# Patient Record
Sex: Male | Born: 1955 | Race: White | Hispanic: No | Marital: Married
Health system: Midwestern US, Academic
[De-identification: ages and names within clinical notes are randomized; demographics above are authoritative.]

---

## 2016-11-26 ENCOUNTER — Ambulatory Visit: Admit: 2016-11-26 | Discharge: 2016-11-27 | Payer: BC Managed Care – PPO | Primary: Family

## 2016-11-26 ENCOUNTER — Encounter: Admit: 2016-11-26 | Discharge: 2016-11-26 | Payer: BC Managed Care – PPO | Primary: Family

## 2016-11-26 DIAGNOSIS — E119 Type 2 diabetes mellitus without complications: ICD-10-CM

## 2016-11-26 DIAGNOSIS — I2510 Atherosclerotic heart disease of native coronary artery without angina pectoris: Principal | ICD-10-CM

## 2016-11-26 DIAGNOSIS — Z720 Tobacco use: ICD-10-CM

## 2016-11-26 DIAGNOSIS — T50905A Adverse effect of unspecified drugs, medicaments and biological substances, initial encounter: ICD-10-CM

## 2016-11-26 DIAGNOSIS — E785 Hyperlipidemia, unspecified: ICD-10-CM

## 2016-11-26 DIAGNOSIS — I10 Essential (primary) hypertension: ICD-10-CM

## 2016-11-26 NOTE — Progress Notes
Date of Service: 11/26/2016    Adam Galloway is a 61 y.o. male.       HPI     It was a pleasure to see Adam Galloway my office today for his cardiovascular follow-up.  He has been doing fairly good as far as his cardiac symptoms are concerned.  He did get evaluated and admitted for a few hours in the emergency room in [place] because he was dehydrated and had electrolyte abnormalities including hyperkalemia.  He denies any chest discomfort.  No significant shortness of breath.  Unfortunately still continues to smoke.  I spent a lot of time last time talking to him about smoking cessation.  We discussed this again today.  I have counseled him on the significant risks he carries with him from continued tobacco abuse since he is already had a heart attack.  I do not believe he has any plans to quit at least at this time.  On physical examination is alert and oriented in no distress.  Vital signs are stable.  Lungs are clear.  Heart sounds are regular.  Extremity shows no edema.  Peripheral pulses appeared to be good.  I am concerned about this last admission with hyperkalemia.  He is on ACE inhibitor and spironolactone.  That combination can also cause hyperkalemia.  He says his last potassium was down to normal limits.  I would like to follow-up a BMP to make sure his potassium is good.  If his potassium continues to remain high normal recommend discontinuing the Spironolactone.  I also ordered a follow-up echo Doppler.  Last echo Doppler had shown a reduction in his left ventricular function.  Total time spent with the patient including smoking cessation counseling was approximately 25 minutes in the office.         Vitals:    11/26/16 0834 11/26/16 0842   BP: 116/80 107/72   Pulse: 81    Weight: 88.5 kg (195 lb)    Height: 1.829 m (6')      Body mass index is 26.45 kg/m???.     Past Medical History  Patient Active Problem List    Diagnosis Date Noted   ??? Asystole (HCC) 05/17/2016   ??? Essential hypertension 04/16/2015 ??? Dyslipidemia    ??? DM (diabetes mellitus), type 2 (HCC)    ??? Tobacco abuse    ??? HLD (hyperlipidemia) 01/16/2008   ??? CAD (coronary artery disease) 01/16/2008     2009-Previous anterior wall myocardial infarction with complete recovery and status post PCI of the diagonal branch.  12/16/14- [facility]- STEMI- PCI- PLAD    8/8/16[facility]-   Echocardiogram- EF 50-55%, no focal left ventricular wall motion abnormalities     ??? STEMI (ST elevation myocardial infarction) (HCC) 01/16/2008   ??? Anxiety 01/16/2008   ??? Hyperglycemia 01/16/2008         Review of Systems   HENT: Positive for congestion.    Respiratory: Positive for snoring.    Musculoskeletal: Positive for joint pain.   Neurological: Positive for excessive daytime sleepiness and sensory change.   All other systems reviewed and are negative.      Physical Exam  General Appearance: moderately over weight   Lips &???Oral Mucosa: no pallor or cyanosis   Neck Veins: neck veins are flat, neck veins are not distended   Thyroid: no nodules, masses, tenderness or enlargement   Chest Inspection: chest is normal in appearance   Respiratory Effort: breathing comfortably, no respiratory distress   Auscultation/Percussion: lungs clear  to auscultation, no rales or rhonchi, no wheezing   PMI: PMI not enlarged or displaced   Cardiac Rhythm: regular rhythm and normal rate   Cardiac Auscultation: S1, S2 normal, no rub, no gallop   Murmurs: no murmur   Carotid Arteries: normal carotid upstroke bilaterally, no bruit   Brachial Arteries: normal symmetric brachial pulses   Radial Arteries: normal symmetric radial pulses   Abdominal Aorta: no abdominal aortic bruit   Pedal Pulses: normal symmetric pedal pulses   Lower Extremity Edema: no lower extremity edema   Abdominal Exam: soft, non-tender, no masses, bowel sounds normal   Liver &???Spleen: no organomegaly   Orientation: oriented to time, place and person     Cardiovascular Studies 1. January 13, 2008: Cardiac catheterization secondary to an acute anterior wall   myocardial infarction. Reperfusion of LAD, high-grade lesion in the diagonal branch,   a 2.5 Xience stent placed there. Full report in the [facility].   2. January 16, 2008: Echo Doppler ejection fraction improved to 60 percent.   3. January 16, 2008: Renal duplex study. No evidence of any significant stenosis in   the renal arteries.   4. January 11, 2008: Exercise stress MPI. No evidence of fixed or reversible   perfusion defect, ejection fraction 51 percent.   5. October 2010, exercise stress test negative for any significant ischemia. Normal ejection fraction   6. November 04, 2012, regadenoson stress thallium showed no evidence of any significant ischemia. Ejection fraction at 50%.   7. December 17, 2014 cardiac catheterization showed previously placed stent in the LAD was patent. A proximal to mid LAD lesion of 80% had a 3 x 28 mm drug-eluting stent placed and post dilated with a 3 x 20 mm balloon. There was also a 60% lesion of the left PDA and 90% lesion of the nondominant right.   8. December 16, 2014 echo Doppler revealed a left ventricular function of 50% to 55%. No significant valvular abnormalities were noted.   9. May 17, 2016 echo Doppler.  Left ventricular cavity size is normal.  Moderately decreased systolic function with ejection fraction of 40-45%.  No significant valvular abnormalities.  Trace pericardial effusion.  10. May 17, 2016 cardiac catheterization.  Moderate coronary artery disease involving the left circumflex artery as well as the posterior left ventricular and posterior descending arterial branch of the left circumflex artery.  High-grade lesion of a small nondominant right coronary artery. Patent stents in the proximal left anterior descending artery and the proximal first diagonal vessel.  Normal left ventricular end-diastolic pressures.    Problems Addressed Today  No diagnosis found. Assessment and Plan     1.  Coronary artery disease with previous anterior wall myocardial function.  Patient had a repeat cardiac catheterization secondary to a complication after his stress test in the office.  Cardiac catheterization showed patent stents.  Patient continues to do well with no symptoms suggestive of angina.  2.  Ischemic cardiomyopathy.  Patient on optimal medications.  Would recommend a follow-up echo Doppler to see if there has been interval improvement in patient's left ventricular function  3.  Hypertension.  Blood pressures are pretty well controlled.  4.  Dyslipidemia.  Good lipid numbers.  5.  Type 2 diabetes mellitus.  Follows with his PCP.  6.  History of smoking.  Unfortunately patient continues to smoke.  He has no plans to quit at this time.         Current Medications (including today's revisions)  ???  ALPRAZolam (XANAX) 0.5 mg tablet Take 0.5 mg by mouth as Needed.   ??? aspirin EC 81 mg tablet Take 81 mg by mouth daily. Take with food.   ??? atorvastatin (LIPITOR) 20 mg tablet Take 20 mg by mouth daily.   ??? carvedilol (COREG) 12.5 mg tablet Take 1 tablet by mouth twice daily. Take with food.   ??? citalopram (CELEXA) 20 mg PO tablet Take  by mouth Daily.   ??? empagliflozin (JARDIANCE) 10 mg tab Take 25 mg by mouth.   ??? insulin aspart (NOVOLOG FLEXPEN) 100 unit/mL injection PEN Inject 10 Units under the skin three times daily with meals.   ??? insulin glargine (TOUJEO SOLOSTAR) 300 unit/mL (1.5 mL) injectable Inject 50 Units under the skin daily.   ??? lisinopril (PRINIVIL; ZESTRIL) 20 mg tablet Take one tablet by mouth twice daily   ??? metFORMIN (GLUCOPHAGE) 500 mg tablet Take 2 tablets by mouth twice daily with meals.   ??? nitroglycerin (NITROSTAT) 0.4 mg tablet Place 1 Tab under tongue every 5 minutes as needed for Chest Pain.   ??? spironolactone (ALDACTONE) 25 mg tablet Take 1 tablet by mouth daily. Take with food.

## 2016-11-27 DIAGNOSIS — I255 Ischemic cardiomyopathy: ICD-10-CM

## 2016-11-27 DIAGNOSIS — Z955 Presence of coronary angioplasty implant and graft: ICD-10-CM

## 2016-11-27 DIAGNOSIS — I10 Essential (primary) hypertension: ICD-10-CM

## 2016-11-27 DIAGNOSIS — Z720 Tobacco use: Secondary | ICD-10-CM

## 2016-11-27 DIAGNOSIS — E782 Mixed hyperlipidemia: Secondary | ICD-10-CM

## 2016-11-27 DIAGNOSIS — I252 Old myocardial infarction: ICD-10-CM

## 2016-11-27 DIAGNOSIS — E785 Hyperlipidemia, unspecified: ICD-10-CM

## 2016-11-27 DIAGNOSIS — E119 Type 2 diabetes mellitus without complications: ICD-10-CM

## 2016-11-27 DIAGNOSIS — I2510 Atherosclerotic heart disease of native coronary artery without angina pectoris: Principal | ICD-10-CM

## 2016-12-07 ENCOUNTER — Encounter: Admit: 2016-12-07 | Discharge: 2016-12-07 | Payer: BC Managed Care – PPO | Primary: Family

## 2016-12-07 LAB — BASIC METABOLIC PANEL
Lab: 1
Lab: 138
Lab: 16
Lab: 165 — ABNORMAL HIGH (ref 80–115)
Lab: 19
Lab: 23
Lab: 4.4
Lab: 76
Lab: 88
Lab: 9.9

## 2016-12-07 NOTE — Telephone Encounter
BMP ordered at [facility] on 11/26/16 and entered. Pt on spironolactone 25 and lisinopril.  Routed to Dr.Mehta for his review

## 2016-12-10 ENCOUNTER — Ambulatory Visit: Admit: 2016-12-10 | Discharge: 2016-12-10 | Payer: BC Managed Care – PPO | Primary: Family

## 2016-12-10 DIAGNOSIS — Z720 Tobacco use: ICD-10-CM

## 2016-12-10 DIAGNOSIS — I10 Essential (primary) hypertension: ICD-10-CM

## 2016-12-10 DIAGNOSIS — E782 Mixed hyperlipidemia: ICD-10-CM

## 2016-12-10 DIAGNOSIS — I2510 Atherosclerotic heart disease of native coronary artery without angina pectoris: Principal | ICD-10-CM

## 2016-12-10 MED ORDER — PERFLUTREN LIPID MICROSPHERES 1.1 MG/ML IV SUSP
1-20 mL | Freq: Once | INTRAVENOUS | 0 refills | Status: CP
Start: 2016-12-10 — End: ?
  Administered 2016-12-10: 15:00:00 2 mL via INTRAVENOUS

## 2016-12-10 NOTE — Progress Notes
Peripheral IV Insertion Note:  Patient Side: right  Line Orientation:Antecubital  IV Catheter Size: 22G  Number of Attempts:1.  IV capped and flushed with Normal Saline.  IV site without redness, swelling, or pain.  New dressing placed.    After procedure IV cannula removed intact and hemostasis achieved.

## 2016-12-10 NOTE — Telephone Encounter
Message   Received: 3 days ago   Message Contents   Tarri Abernethy, MD  Levin Erp, RN   Caller: Unspecified (3 days ago, 2:07 PM)            Potassium looks good. No changes at this time. Thank you    Previous Messages         Noted response from Dr.Mehta as above.

## 2016-12-24 ENCOUNTER — Encounter: Admit: 2016-12-24 | Discharge: 2016-12-24 | Payer: BC Managed Care – PPO | Primary: Family

## 2016-12-24 NOTE — Telephone Encounter
Pt called to request his echo results.     Reviewed the available information, and explained we would give him the formal update once it was reviewed by Dr. Murvin Natal.   Answered questions and the patient verbalized understanding.

## 2017-01-07 ENCOUNTER — Encounter: Admit: 2017-01-07 | Discharge: 2017-01-07 | Payer: BC Managed Care – PPO | Primary: Family

## 2017-01-07 NOTE — Telephone Encounter
-----   Message from Tarri Abernethy, MD sent at 01/05/2017  6:16 PM CDT -----  Echocardiogram revealed normal left ventricular function.  No significant valvular abnormalities were noted.

## 2017-01-07 NOTE — Telephone Encounter
WNL echo results mailed along with mychart link

## 2017-03-02 ENCOUNTER — Encounter: Admit: 2017-03-02 | Discharge: 2017-03-02 | Payer: BC Managed Care – PPO | Primary: Family

## 2017-03-02 MED ORDER — NITROGLYCERIN 0.4 MG SL SUBL
.4 mg | ORAL_TABLET | SUBLINGUAL | 2 refills | 9.00000 days | Status: AC | PRN
Start: 2017-03-02 — End: 2018-03-14

## 2017-06-03 ENCOUNTER — Encounter: Admit: 2017-06-03 | Discharge: 2017-06-03 | Payer: BC Managed Care – PPO | Primary: Family

## 2017-06-03 MED ORDER — SPIRONOLACTONE 25 MG PO TAB
25 mg | ORAL_TABLET | Freq: Every day | ORAL | 3 refills | 46.00000 days | Status: AC
Start: 2017-06-03 — End: 2017-08-16

## 2017-07-18 ENCOUNTER — Encounter: Admit: 2017-07-18 | Discharge: 2017-07-18 | Payer: BC Managed Care – PPO | Primary: Family

## 2017-07-18 MED ORDER — CARVEDILOL 12.5 MG PO TAB
12.5 mg | ORAL_TABLET | Freq: Two times a day (BID) | ORAL | 1 refills | 90.00000 days | Status: AC
Start: 2017-07-18 — End: 2018-01-10

## 2017-08-11 ENCOUNTER — Encounter: Admit: 2017-08-11 | Discharge: 2017-08-11 | Payer: BC Managed Care – PPO | Primary: Family

## 2017-08-11 DIAGNOSIS — E119 Type 2 diabetes mellitus without complications: ICD-10-CM

## 2017-08-11 DIAGNOSIS — I2510 Atherosclerotic heart disease of native coronary artery without angina pectoris: Principal | ICD-10-CM

## 2017-08-11 DIAGNOSIS — T50905A Adverse effect of unspecified drugs, medicaments and biological substances, initial encounter: ICD-10-CM

## 2017-08-11 DIAGNOSIS — Z720 Tobacco use: ICD-10-CM

## 2017-08-11 DIAGNOSIS — I10 Essential (primary) hypertension: ICD-10-CM

## 2017-08-11 DIAGNOSIS — E785 Hyperlipidemia, unspecified: ICD-10-CM

## 2017-08-16 ENCOUNTER — Encounter: Admit: 2017-08-16 | Discharge: 2017-08-16 | Payer: BC Managed Care – PPO | Primary: Family

## 2017-08-16 DIAGNOSIS — E119 Type 2 diabetes mellitus without complications: ICD-10-CM

## 2017-08-16 DIAGNOSIS — I252 Old myocardial infarction: Secondary | ICD-10-CM

## 2017-08-16 DIAGNOSIS — E785 Hyperlipidemia, unspecified: ICD-10-CM

## 2017-08-16 DIAGNOSIS — I2510 Atherosclerotic heart disease of native coronary artery without angina pectoris: Principal | ICD-10-CM

## 2017-08-16 DIAGNOSIS — I10 Essential (primary) hypertension: ICD-10-CM

## 2017-08-16 DIAGNOSIS — Z720 Tobacco use: ICD-10-CM

## 2017-08-16 DIAGNOSIS — T50905A Adverse effect of unspecified drugs, medicaments and biological substances, initial encounter: ICD-10-CM

## 2017-08-16 MED ORDER — HYDROCHLOROTHIAZIDE 25 MG PO TAB
25 mg | ORAL_TABLET | Freq: Every morning | ORAL | 3 refills | 28.00000 days | Status: AC
Start: 2017-08-16 — End: 2018-08-07

## 2017-08-17 ENCOUNTER — Ambulatory Visit: Admit: 2017-08-16 | Discharge: 2017-08-17 | Payer: BC Managed Care – PPO | Primary: Family

## 2017-08-17 DIAGNOSIS — E782 Mixed hyperlipidemia: ICD-10-CM

## 2017-08-17 DIAGNOSIS — I10 Essential (primary) hypertension: ICD-10-CM

## 2017-08-17 DIAGNOSIS — Z794 Long term (current) use of insulin: ICD-10-CM

## 2017-08-17 DIAGNOSIS — Z720 Tobacco use: ICD-10-CM

## 2017-08-17 DIAGNOSIS — Z955 Presence of coronary angioplasty implant and graft: ICD-10-CM

## 2017-08-17 DIAGNOSIS — E119 Type 2 diabetes mellitus without complications: ICD-10-CM

## 2017-08-17 DIAGNOSIS — I440 Atrioventricular block, first degree: ICD-10-CM

## 2017-08-17 DIAGNOSIS — Z79899 Other long term (current) drug therapy: ICD-10-CM

## 2017-08-17 DIAGNOSIS — I255 Ischemic cardiomyopathy: Secondary | ICD-10-CM

## 2017-08-17 DIAGNOSIS — I2510 Atherosclerotic heart disease of native coronary artery without angina pectoris: Principal | ICD-10-CM

## 2017-10-06 ENCOUNTER — Encounter: Admit: 2017-10-06 | Discharge: 2017-10-06 | Payer: BC Managed Care – PPO | Primary: Family

## 2017-10-06 ENCOUNTER — Encounter: Admit: 2017-10-06 | Discharge: 2017-10-07 | Payer: BC Managed Care – PPO | Primary: Family

## 2017-10-06 DIAGNOSIS — E119 Type 2 diabetes mellitus without complications: ICD-10-CM

## 2017-10-06 DIAGNOSIS — G4733 Obstructive sleep apnea (adult) (pediatric): ICD-10-CM

## 2017-10-06 DIAGNOSIS — I10 Essential (primary) hypertension: ICD-10-CM

## 2017-10-06 DIAGNOSIS — E785 Hyperlipidemia, unspecified: ICD-10-CM

## 2017-10-06 DIAGNOSIS — Z794 Long term (current) use of insulin: ICD-10-CM

## 2017-10-06 DIAGNOSIS — I2510 Atherosclerotic heart disease of native coronary artery without angina pectoris: Principal | ICD-10-CM

## 2017-10-06 DIAGNOSIS — F17210 Nicotine dependence, cigarettes, uncomplicated: ICD-10-CM

## 2017-10-06 DIAGNOSIS — D751 Secondary polycythemia: Principal | ICD-10-CM

## 2017-10-06 DIAGNOSIS — T50905A Adverse effect of unspecified drugs, medicaments and biological substances, initial encounter: ICD-10-CM

## 2017-10-06 DIAGNOSIS — F17200 Nicotine dependence, unspecified, uncomplicated: ICD-10-CM

## 2017-10-06 DIAGNOSIS — Z720 Tobacco use: ICD-10-CM

## 2017-10-06 DIAGNOSIS — H5440 Blindness, one eye, unspecified eye: ICD-10-CM

## 2017-10-06 DIAGNOSIS — K219 Gastro-esophageal reflux disease without esophagitis: ICD-10-CM

## 2017-10-06 LAB — VITAMIN B12: Lab: 201 pg/mL (ref 180–914)

## 2017-10-06 LAB — CBC AND DIFF
Lab: 0.1 10*3/uL (ref 0–0.20)
Lab: 0.4 10*3/uL (ref 0–0.45)
Lab: 1 % (ref >60)
Lab: 1 10*3/uL — ABNORMAL HIGH (ref 0–0.80)
Lab: 11 % (ref 4–12)
Lab: 14 % (ref 11–15)
Lab: 17 g/dL — ABNORMAL HIGH (ref 13.5–16.5)
Lab: 185 10*3/uL (ref 150–400)
Lab: 2.2 10*3/uL (ref 1.0–4.8)
Lab: 23 % — ABNORMAL LOW (ref 24–44)
Lab: 31 pg (ref 26–34)
Lab: 34 g/dL (ref 32.0–36.0)
Lab: 5 % — ABNORMAL LOW (ref >60)
Lab: 5.5 M/UL — ABNORMAL HIGH (ref 4.4–5.5)
Lab: 51 % — ABNORMAL HIGH (ref 40–50)
Lab: 6.1 10*3/uL (ref 1.8–7.0)
Lab: 6.6 FL — ABNORMAL LOW (ref 7–11)
Lab: 60 % (ref 41–77)
Lab: 9.9 K/UL (ref 4.5–11.0)
Lab: 92 FL (ref 80–100)

## 2017-10-06 LAB — RETICULOCYTE COUNT
Lab: 1.5 % (ref 0.5–2.0)
Lab: 1.7 %
Lab: 83 10*3/uL (ref 30–94)

## 2017-10-06 LAB — IRON + BINDING CAPACITY + %SAT+ FERRITIN
Lab: 15 % — ABNORMAL LOW (ref 28–42)
Lab: 448 ug/dL — ABNORMAL HIGH (ref 270–380)
Lab: 69 ug/dL (ref 50–185)

## 2017-10-06 LAB — ERYTHROPOIETIN: Lab: 13 mU/mL (ref 3.7–29.5)

## 2017-10-06 LAB — COMPREHENSIVE METABOLIC PANEL
Lab: 135 MMOL/L — ABNORMAL LOW (ref 137–147)
Lab: 3.9 MMOL/L (ref 3.5–5.1)

## 2017-10-06 LAB — FOLATE, SERUM: Lab: 7.4 ng/mL (ref >3.9)

## 2017-10-06 LAB — LDH-LACTATE DEHYDROGENASE: Lab: 167 U/L (ref 100–210)

## 2017-10-12 LAB — JAK2 BLOOD V617F VARIANT NO REFLEX TEST

## 2017-11-09 ENCOUNTER — Encounter: Admit: 2017-11-09 | Discharge: 2017-11-09 | Payer: BC Managed Care – PPO | Primary: Family

## 2017-11-09 ENCOUNTER — Encounter: Admit: 2017-11-09 | Discharge: 2017-11-10 | Payer: BC Managed Care – PPO | Primary: Family

## 2017-11-09 DIAGNOSIS — K219 Gastro-esophageal reflux disease without esophagitis: ICD-10-CM

## 2017-11-09 DIAGNOSIS — G4733 Obstructive sleep apnea (adult) (pediatric): ICD-10-CM

## 2017-11-09 DIAGNOSIS — F17210 Nicotine dependence, cigarettes, uncomplicated: ICD-10-CM

## 2017-11-09 DIAGNOSIS — I2510 Atherosclerotic heart disease of native coronary artery without angina pectoris: Principal | ICD-10-CM

## 2017-11-09 DIAGNOSIS — E119 Type 2 diabetes mellitus without complications: ICD-10-CM

## 2017-11-09 DIAGNOSIS — D45 Polycythemia vera: Principal | ICD-10-CM

## 2017-11-09 DIAGNOSIS — F17200 Nicotine dependence, unspecified, uncomplicated: ICD-10-CM

## 2017-11-09 DIAGNOSIS — T50905A Adverse effect of unspecified drugs, medicaments and biological substances, initial encounter: ICD-10-CM

## 2017-11-09 DIAGNOSIS — Z720 Tobacco use: ICD-10-CM

## 2017-11-09 DIAGNOSIS — H5440 Blindness, one eye, unspecified eye: ICD-10-CM

## 2017-11-09 DIAGNOSIS — N281 Cyst of kidney, acquired: ICD-10-CM

## 2017-11-09 DIAGNOSIS — E785 Hyperlipidemia, unspecified: ICD-10-CM

## 2017-11-09 DIAGNOSIS — I10 Essential (primary) hypertension: ICD-10-CM

## 2017-11-09 LAB — CBC AND DIFF
Lab: 0.1 10*3/uL (ref 0–0.20)
Lab: 0.5 10*3/uL — ABNORMAL HIGH (ref 0–0.45)
Lab: 0.8 10*3/uL (ref 0–0.80)
Lab: 1 % — ABNORMAL LOW (ref >60)
Lab: 10 K/UL (ref 4.5–11.0)
Lab: 14 % (ref 11–15)
Lab: 15 g/dL — ABNORMAL HIGH (ref 13.5–16.5)
Lab: 2.1 10*3/uL (ref 1.0–4.8)
Lab: 21 % — ABNORMAL LOW (ref 24–44)
Lab: 219 K/UL (ref 150–400)
Lab: 31 pg (ref 26–34)
Lab: 33 g/dL (ref 32.0–36.0)
Lab: 4.9 M/UL — ABNORMAL HIGH (ref 4.4–5.5)
Lab: 46 % — ABNORMAL HIGH (ref 40–50)
Lab: 5 % — ABNORMAL LOW (ref >60)
Lab: 6.3 FL — ABNORMAL LOW (ref 7–11)
Lab: 6.7 10*3/uL (ref 1.8–7.0)
Lab: 65 % (ref 41–77)
Lab: 8 % (ref 4–12)
Lab: 94 FL (ref 80–100)

## 2017-11-10 LAB — IRON + BINDING CAPACITY + %SAT+ FERRITIN
Lab: 109 ug/dL (ref 50–185)
Lab: 422 ug/dL — ABNORMAL HIGH (ref 270–380)

## 2017-11-10 LAB — COMPREHENSIVE METABOLIC PANEL
Lab: 135 MMOL/L — ABNORMAL LOW (ref 137–147)
Lab: 4.1 MMOL/L (ref 3.5–5.1)

## 2017-12-07 ENCOUNTER — Encounter: Admit: 2017-12-07 | Discharge: 2017-12-07 | Payer: BC Managed Care – PPO | Primary: Family

## 2017-12-07 DIAGNOSIS — E119 Type 2 diabetes mellitus without complications: ICD-10-CM

## 2017-12-07 DIAGNOSIS — T50905A Adverse effect of unspecified drugs, medicaments and biological substances, initial encounter: ICD-10-CM

## 2017-12-07 DIAGNOSIS — F17210 Nicotine dependence, cigarettes, uncomplicated: ICD-10-CM

## 2017-12-07 DIAGNOSIS — Z720 Tobacco use: ICD-10-CM

## 2017-12-07 DIAGNOSIS — D751 Secondary polycythemia: Principal | ICD-10-CM

## 2017-12-07 DIAGNOSIS — I2510 Atherosclerotic heart disease of native coronary artery without angina pectoris: Principal | ICD-10-CM

## 2017-12-07 DIAGNOSIS — I10 Essential (primary) hypertension: ICD-10-CM

## 2017-12-07 DIAGNOSIS — E785 Hyperlipidemia, unspecified: ICD-10-CM

## 2017-12-07 LAB — CBC
Lab: 14 % (ref <=5)
Lab: 206 10*3/uL (ref 150–400)
Lab: 6.4 FL — ABNORMAL LOW (ref 7–11)
Lab: 9.4 10*3/uL (ref 4.5–11.0)

## 2018-01-04 ENCOUNTER — Encounter: Admit: 2018-01-04 | Discharge: 2018-01-05 | Payer: BC Managed Care – PPO | Primary: Family

## 2018-01-04 ENCOUNTER — Encounter: Admit: 2018-01-04 | Discharge: 2018-01-04 | Payer: BC Managed Care – PPO | Primary: Family

## 2018-01-04 DIAGNOSIS — F17200 Nicotine dependence, unspecified, uncomplicated: ICD-10-CM

## 2018-01-04 DIAGNOSIS — D751 Secondary polycythemia: Principal | ICD-10-CM

## 2018-01-04 DIAGNOSIS — I10 Essential (primary) hypertension: ICD-10-CM

## 2018-01-04 DIAGNOSIS — E119 Type 2 diabetes mellitus without complications: ICD-10-CM

## 2018-01-04 DIAGNOSIS — T50905A Adverse effect of unspecified drugs, medicaments and biological substances, initial encounter: ICD-10-CM

## 2018-01-04 DIAGNOSIS — E785 Hyperlipidemia, unspecified: ICD-10-CM

## 2018-01-04 DIAGNOSIS — Z720 Tobacco use: ICD-10-CM

## 2018-01-04 DIAGNOSIS — I2510 Atherosclerotic heart disease of native coronary artery without angina pectoris: Principal | ICD-10-CM

## 2018-01-04 LAB — CBC AND DIFF
Lab: 11 10*3/uL — ABNORMAL HIGH (ref 4.5–11.0)
Lab: 14 % (ref 11–15)
Lab: 275 10*3/uL (ref 150–400)
Lab: 31 pg (ref 26–34)
Lab: 33 g/dL (ref 32.0–36.0)
Lab: 48 % (ref 40–50)
Lab: 5 M/UL (ref 4.4–5.5)
Lab: 6.3 FL — ABNORMAL LOW (ref 7–11)
Lab: 94 FL (ref 80–100)

## 2018-01-05 LAB — FERRITIN: Lab: 16 ng/mL — ABNORMAL LOW (ref 30–300)

## 2018-01-10 ENCOUNTER — Encounter: Admit: 2018-01-10 | Discharge: 2018-01-10 | Payer: BC Managed Care – PPO | Primary: Family

## 2018-01-10 MED ORDER — CARVEDILOL 12.5 MG PO TAB
12.5 mg | ORAL_TABLET | Freq: Two times a day (BID) | ORAL | 3 refills | 90.00000 days | Status: AC
Start: 2018-01-10 — End: 2019-01-01

## 2018-01-31 ENCOUNTER — Ambulatory Visit: Admit: 2018-01-31 | Discharge: 2018-02-01 | Payer: BC Managed Care – PPO | Primary: Family

## 2018-01-31 ENCOUNTER — Encounter: Admit: 2018-01-31 | Discharge: 2018-01-31 | Payer: BC Managed Care – PPO | Primary: Family

## 2018-01-31 DIAGNOSIS — I2510 Atherosclerotic heart disease of native coronary artery without angina pectoris: Principal | ICD-10-CM

## 2018-01-31 DIAGNOSIS — Z720 Tobacco use: ICD-10-CM

## 2018-01-31 DIAGNOSIS — E785 Hyperlipidemia, unspecified: ICD-10-CM

## 2018-01-31 DIAGNOSIS — E119 Type 2 diabetes mellitus without complications: ICD-10-CM

## 2018-01-31 DIAGNOSIS — I10 Essential (primary) hypertension: ICD-10-CM

## 2018-01-31 DIAGNOSIS — T50905A Adverse effect of unspecified drugs, medicaments and biological substances, initial encounter: ICD-10-CM

## 2018-02-01 ENCOUNTER — Encounter: Admit: 2018-02-01 | Discharge: 2018-02-01 | Payer: BC Managed Care – PPO | Primary: Family

## 2018-02-01 ENCOUNTER — Encounter: Admit: 2018-02-01 | Discharge: 2018-02-02 | Payer: BC Managed Care – PPO | Primary: Family

## 2018-02-01 DIAGNOSIS — M25511 Pain in right shoulder: ICD-10-CM

## 2018-02-01 DIAGNOSIS — D751 Secondary polycythemia: Principal | ICD-10-CM

## 2018-02-01 DIAGNOSIS — E785 Hyperlipidemia, unspecified: ICD-10-CM

## 2018-02-01 DIAGNOSIS — G4730 Sleep apnea, unspecified: ICD-10-CM

## 2018-02-01 DIAGNOSIS — T50905A Adverse effect of unspecified drugs, medicaments and biological substances, initial encounter: ICD-10-CM

## 2018-02-01 DIAGNOSIS — Z720 Tobacco use: ICD-10-CM

## 2018-02-01 DIAGNOSIS — F17200 Nicotine dependence, unspecified, uncomplicated: ICD-10-CM

## 2018-02-01 DIAGNOSIS — E119 Type 2 diabetes mellitus without complications: ICD-10-CM

## 2018-02-01 DIAGNOSIS — I10 Essential (primary) hypertension: ICD-10-CM

## 2018-02-01 DIAGNOSIS — I2510 Atherosclerotic heart disease of native coronary artery without angina pectoris: Principal | ICD-10-CM

## 2018-02-01 LAB — CBC AND DIFF
Lab: 0 10*3/uL (ref 0–0.20)
Lab: 0.7 10*3/uL — ABNORMAL HIGH (ref 0–0.45)
Lab: 0.8 10*3/uL (ref 0–0.80)
Lab: 1 % — ABNORMAL LOW (ref >60)
Lab: 1.9 10*3/uL (ref 1.0–4.8)
Lab: 10 % (ref 4–12)
Lab: 13 % (ref 11–15)
Lab: 15 g/dL — ABNORMAL HIGH (ref 13.5–16.5)
Lab: 24 % (ref 24–44)
Lab: 255 10*3/uL (ref 150–400)
Lab: 30 pg (ref 26–34)
Lab: 33 g/dL (ref 32.0–36.0)
Lab: 4.5 10*3/uL (ref 1.8–7.0)
Lab: 4.9 M/UL — ABNORMAL HIGH (ref 4.4–5.5)
Lab: 45 % — ABNORMAL HIGH (ref 40–50)
Lab: 56 % (ref 41–77)
Lab: 6.4 FL — ABNORMAL LOW (ref 7–11)
Lab: 7.9 K/UL (ref 4.5–11.0)
Lab: 9 % — ABNORMAL HIGH (ref >60)
Lab: 91 FL (ref 80–100)

## 2018-02-02 LAB — IRON + BINDING CAPACITY + %SAT+ FERRITIN
Lab: 42 ug/dL — ABNORMAL LOW (ref 50–185)
Lab: 516 ug/dL — ABNORMAL HIGH (ref 270–380)

## 2018-02-02 LAB — COMPREHENSIVE METABOLIC PANEL
Lab: 135 MMOL/L — ABNORMAL LOW (ref 137–147)
Lab: 4.5 MMOL/L — ABNORMAL LOW (ref 3.5–5.1)

## 2018-03-14 ENCOUNTER — Encounter: Admit: 2018-03-14 | Discharge: 2018-03-14 | Payer: BC Managed Care – PPO | Primary: Family

## 2018-03-14 MED ORDER — NITROGLYCERIN 0.4 MG SL SUBL
.4 mg | ORAL_TABLET | SUBLINGUAL | 3 refills | 9.00000 days | Status: AC | PRN
Start: 2018-03-14 — End: ?

## 2018-03-27 ENCOUNTER — Encounter: Admit: 2018-03-27 | Discharge: 2018-03-27 | Payer: BC Managed Care – PPO | Primary: Family

## 2018-03-27 ENCOUNTER — Encounter: Admit: 2018-03-27 | Discharge: 2018-03-28 | Payer: BC Managed Care – PPO | Primary: Family

## 2018-03-27 DIAGNOSIS — Z720 Tobacco use: ICD-10-CM

## 2018-03-27 DIAGNOSIS — E785 Hyperlipidemia, unspecified: ICD-10-CM

## 2018-03-27 DIAGNOSIS — D751 Secondary polycythemia: Principal | ICD-10-CM

## 2018-03-27 DIAGNOSIS — I10 Essential (primary) hypertension: ICD-10-CM

## 2018-03-27 DIAGNOSIS — M25511 Pain in right shoulder: ICD-10-CM

## 2018-03-27 DIAGNOSIS — I2510 Atherosclerotic heart disease of native coronary artery without angina pectoris: Principal | ICD-10-CM

## 2018-03-27 DIAGNOSIS — E119 Type 2 diabetes mellitus without complications: ICD-10-CM

## 2018-03-27 DIAGNOSIS — T50905A Adverse effect of unspecified drugs, medicaments and biological substances, initial encounter: ICD-10-CM

## 2018-03-27 LAB — CBC AND DIFF
Lab: 0.1 10*3/uL (ref 0–0.20)
Lab: 0.6 10*3/uL — ABNORMAL HIGH (ref 0–0.45)
Lab: 0.8 10*3/uL (ref 0–0.80)
Lab: 1 % (ref 0–2)
Lab: 1.9 10*3/uL (ref 1.0–4.8)
Lab: 10 10*3/uL (ref 4.5–11.0)
Lab: 14 g/dL (ref 13.5–16.5)
Lab: 28 pg (ref 26–34)
Lab: 32 g/dL (ref 32.0–36.0)
Lab: 339 10*3/uL (ref 150–400)
Lab: 45 % (ref 40–50)
Lab: 5.1 M/UL — ABNORMAL LOW (ref 4.4–5.5)
Lab: 6 % — ABNORMAL HIGH (ref 0–5)
Lab: 6.5 10*3/uL (ref 1.8–7.0)
Lab: 65 % (ref 41–77)
Lab: 8 % (ref 4–12)
Lab: 88 FL (ref 80–100)

## 2018-04-26 ENCOUNTER — Encounter: Admit: 2018-04-26 | Discharge: 2018-04-26 | Payer: BC Managed Care – PPO | Primary: Family

## 2018-04-26 ENCOUNTER — Encounter: Admit: 2018-04-26 | Discharge: 2018-04-27 | Payer: BC Managed Care – PPO | Primary: Family

## 2018-04-26 DIAGNOSIS — D751 Secondary polycythemia: Principal | ICD-10-CM

## 2018-04-26 DIAGNOSIS — Z720 Tobacco use: ICD-10-CM

## 2018-04-26 DIAGNOSIS — I10 Essential (primary) hypertension: ICD-10-CM

## 2018-04-26 DIAGNOSIS — T50905A Adverse effect of unspecified drugs, medicaments and biological substances, initial encounter: ICD-10-CM

## 2018-04-26 DIAGNOSIS — E785 Hyperlipidemia, unspecified: ICD-10-CM

## 2018-04-26 DIAGNOSIS — I2510 Atherosclerotic heart disease of native coronary artery without angina pectoris: Principal | ICD-10-CM

## 2018-04-26 DIAGNOSIS — E119 Type 2 diabetes mellitus without complications: ICD-10-CM

## 2018-04-26 LAB — CBC AND DIFF
Lab: 0.1 10*3/uL (ref 0–0.20)
Lab: 0.5 10*3/uL — ABNORMAL HIGH (ref 0–0.45)
Lab: 1 % (ref 0–2)
Lab: 1.2 10*3/uL — ABNORMAL HIGH (ref 0–0.80)
Lab: 10 % (ref 4–12)
Lab: 12 10*3/uL — ABNORMAL HIGH (ref 4.5–11.0)
Lab: 15 % — ABNORMAL HIGH (ref 11–15)
Lab: 15 g/dL (ref 13.5–16.5)
Lab: 17 % — ABNORMAL LOW (ref 24–44)
Lab: 28 pg (ref 26–34)
Lab: 32 g/dL (ref 32.0–36.0)
Lab: 326 10*3/uL (ref 150–400)
Lab: 4 % (ref 0–5)
Lab: 46 % (ref 40–50)
Lab: 5.3 M/UL (ref 4.4–5.5)
Lab: 6.3 FL — ABNORMAL LOW (ref 7–11)
Lab: 68 % (ref 41–77)
Lab: 8.5 10*3/uL — ABNORMAL HIGH (ref 1.8–7.0)
Lab: 86 FL (ref 80–100)

## 2018-07-24 LAB — COMPREHENSIVE METABOLIC PANEL
Lab: 0.7
Lab: 1.5 — ABNORMAL HIGH
Lab: 101
Lab: 136
Lab: 141
Lab: 24
Lab: 25
Lab: 3.8
Lab: 34 — ABNORMAL HIGH
Lab: 4.8
Lab: 7.5
Lab: 87
Lab: 9.9

## 2018-07-24 LAB — CBC: Lab: 9.8 10*3/uL (ref 1.8–7.0)

## 2018-07-24 LAB — LIPID PROFILE: Lab: 138

## 2018-08-07 ENCOUNTER — Encounter: Admit: 2018-08-07 | Discharge: 2018-08-07 | Payer: BC Managed Care – PPO | Primary: Family

## 2018-08-07 MED ORDER — HYDROCHLOROTHIAZIDE 25 MG PO TAB
25 mg | ORAL_TABLET | Freq: Every morning | ORAL | 0 refills | 28.00000 days | Status: AC
Start: 2018-08-07 — End: 2018-10-31

## 2018-08-15 ENCOUNTER — Encounter: Admit: 2018-08-15 | Discharge: 2018-08-15 | Payer: BC Managed Care – PPO | Primary: Family

## 2018-08-21 ENCOUNTER — Encounter: Admit: 2018-08-21 | Discharge: 2018-08-21 | Payer: BC Managed Care – PPO | Primary: Family

## 2018-08-22 ENCOUNTER — Encounter: Admit: 2018-08-22 | Discharge: 2018-08-22 | Payer: BC Managed Care – PPO | Primary: Family

## 2018-08-22 ENCOUNTER — Ambulatory Visit: Admit: 2018-08-22 | Discharge: 2018-08-23 | Payer: BC Managed Care – PPO | Primary: Family

## 2018-08-22 DIAGNOSIS — E782 Mixed hyperlipidemia: ICD-10-CM

## 2018-08-22 DIAGNOSIS — E1165 Type 2 diabetes mellitus with hyperglycemia: ICD-10-CM

## 2018-08-22 DIAGNOSIS — I2510 Atherosclerotic heart disease of native coronary artery without angina pectoris: Principal | ICD-10-CM

## 2018-08-22 DIAGNOSIS — E119 Type 2 diabetes mellitus without complications: ICD-10-CM

## 2018-08-22 DIAGNOSIS — Z720 Tobacco use: ICD-10-CM

## 2018-08-22 DIAGNOSIS — E785 Hyperlipidemia, unspecified: ICD-10-CM

## 2018-08-22 DIAGNOSIS — I10 Essential (primary) hypertension: ICD-10-CM

## 2018-08-22 DIAGNOSIS — T50905A Adverse effect of unspecified drugs, medicaments and biological substances, initial encounter: ICD-10-CM

## 2018-08-22 MED ORDER — SILDENAFIL 25 MG PO TAB
25 mg | ORAL_TABLET | ORAL | 1 refills | 45.00000 days | Status: DC | PRN
Start: 2018-08-22 — End: 2019-09-07

## 2018-08-22 NOTE — Progress Notes
Obtained patient's verbal consent to treat them and their agreement to [facility] financial policy and NPP via this telehealth visit during the [facility] Emergency  Date of Service: 08/22/2018    Adam Galloway is a 63 y.o. male.       HPI     Today I visited with Adam Galloway through the telehealth review conference.     As you may recall, Adam Galloway is a 63 year old gentleman with a history of coronary artery disease with previous anterior wall myocardial infarction.  While having a stress test done last year, the patient developed some chest discomfort.  He was taken to the cardiac catheterization laboratory and repeat cardiac catheterization revealed patent stents.  His last echo Doppler revealed his ejection fraction has improved to be in the low-normal range.     Due to the coronavirus public health guidelines, the patient has been staying at home.  He denies any chest discomfort.  There is no shortness of breath.  He denies any fever or chills or body aches.     His only complaint was he is having significant problem with his erectile dysfunction and wanted to know if he could use Viagra.     The patient's medications were reviewed.  Patient is on sublingual nitroglycerin which he has not used for a very long time.       I have advised the patient I will send in a prescription for Viagra and I have given him the instructions of not using a sublingual nitroglycerin after the use of Viagra for about 48 hours.  Patient understands that.  I have sent in a prescription for 25 mg of Viagra.     Limited physical examination is noted below.  The patient denied any significant swelling of his legs.  I did not appreciate any JVD.  The patient also did not appear to be in any respiratory distress while I was talking to the patient.     The recommendations therefore are as follows:     1. Continue with your present medications.  2. I have sent in a prescription at your request of 25 mg of Viagra for p.r.n. use.  Please make sure you do not use a sublingual nitroglycerin for 48 hours after the use of Viagra.  3. We will follow up the lab workup which you have completed with your PCP.  If there are any changes with your medications, we will call you.  Our goal is to continue to keep your LDL less than 70.    4. We requested to continue to work on smoking cessation.   5. We will try to set up a followup appointment with [place] in about 4 months.     Total time spent with the patient including medication review and medication changes and counseling was approximately 25 minutes on the telehealth conference.    ([id number])             Vitals:    08/22/18 0836   BP: 138/82   BP Source: Arm, Left Upper;Arm, Left Lower   Pulse: 75   Weight: 90.4 kg (199 lb 6.4 oz)   Height: 1.829 m (6')   PainSc: Zero     Body mass index is 27.04 kg/m???.     Past Medical History  Patient Active Problem List    Diagnosis Date Noted   ??? Acute shoulder pain due to trauma, right 02/01/2018   ??? 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 OSA (obstructive sleep apnea)  10/06/2017   ??? Asystole (HCC) 05/17/2016   ??? Essential hypertension 04/16/2015   ??? Dyslipidemia    ??? DM (diabetes mellitus), type 2 (HCC)    ??? Tobacco abuse    ??? HLD (hyperlipidemia) 01/16/2008   ??? CAD (coronary artery disease) 01/16/2008     2009-Previous anterior wall myocardial infarction with complete recovery and status post PCI of the diagonal branch.  12/16/14- [facility]- STEMI- PCI- PLAD    8/8/16[facility]-   Echocardiogram- EF 50-55%, no focal left ventricular wall motion abnormalities     ??? STEMI (ST elevation myocardial infarction) (HCC) 01/16/2008   ??? Anxiety 01/16/2008   ??? Hyperglycemia 01/16/2008         Review of Systems   Constitution: Negative.   HENT: Negative.    Eyes: Negative.    Cardiovascular: Negative.    Respiratory: Negative.    Endocrine: Negative.    Hematologic/Lymphatic: Negative.    Skin: Negative.    Musculoskeletal: Positive for joint pain. to 55%. No significant valvular abnormalities were noted.   9. May 17, 2016 echo Doppler. ???Left ventricular cavity size is normal. ???Moderately decreased systolic function with ejection fraction of 40-45%. ???No significant valvular abnormalities. ???Trace pericardial effusion.  10. May 17, 2016 cardiac catheterization. ???Moderate coronary artery disease involving the left circumflex artery as well as the posterior left ventricular and posterior descending arterial branch of the left circumflex artery. ???High-grade lesion of a small nondominant right coronary artery. Patent stents in the proximal left anterior descending artery and the proximal first diagonal vessel. ???Normal left ventricular end-diastolic pressures.  December 10, 2016 echo Doppler.  Left ventricle ejection fraction of 55%.  No significant Doppler abnormalities.    Problems Addressed Today  No diagnosis found.    Assessment and Plan     1. ???Coronary artery disease with previous anterior wall myocardial function. ???Patient had a repeat cardiac catheterization secondary to a complication after his stress test in the office. ???Cardiac catheterization showed patent stents. ???Patient continues to do well with no symptoms suggestive of angina.  2. ???Ischemic cardiomyopathy. ???Patient on optimal medications.  Recent echo Doppler revealed normal LV function.  Clinically no signs of heart failure  3. ???Hypertension. ???Blood pressures are very well controlled at this time.  4. ???Dyslipidemia. ???Good lipid numbers.  Will check with the PCP on his recent lab work-up.  5. ???Type 2 diabetes mellitus. ???Follows with his PCP.  6. ???History of smoking. ???Unfortunately patient continues to smoke. ???He has no plans to quit at this time.  Smoking cessation was discussed again today with the patient.         Current Medications (including today's revisions)  ??? ALPRAZolam (XANAX) 0.5 mg tablet Take 0.5 mg by mouth as Needed. ??? aspirin EC 81 mg tablet Take 81 mg by mouth daily. Take with food.   ??? atorvastatin (LIPITOR) 20 mg tablet Take 20 mg by mouth daily.   ??? carvedilol (COREG) 12.5 mg tablet Take one tablet by mouth twice daily. Take with food.   ??? citalopram (CELEXA) 20 mg PO tablet Take  by mouth Daily.   ??? empagliflozin (JARDIANCE) 10 mg tab Take 25 mg by mouth.   ??? hydroCHLOROthiazide (HYDRODIURIL) 25 mg tablet Take one tablet by mouth every morning.   ??? insulin glargine (TOUJEO SOLOSTAR) 300 unit/mL (1.5 mL) injectable Inject 60 Units under the skin daily.   ??? lisinopriL (ZESTRIL) 40 mg tablet Take 40 mg by mouth daily.   ??? metFORMIN (GLUCOPHAGE) 500 mg tablet Take  2 tablets by mouth twice daily with meals.   ??? nitroglycerin (NITROSTAT) 0.4 mg tablet Place one tablet under tongue every 5 minutes as needed for Chest Pain.   ??? pantoprazole DR (PROTONIX) 40 mg tablet Take 40 mg by mouth daily.

## 2018-08-22 NOTE — Progress Notes
Patient: Adam Galloway, DOB: [birth year]    Please send most recent lab report (from last 12 months) for Dr [name] to review    Please fax to [phone]    Thank you!

## 2018-08-31 ENCOUNTER — Encounter: Admit: 2018-08-31 | Discharge: 2018-08-31 | Payer: BC Managed Care – PPO | Primary: Family

## 2018-10-31 ENCOUNTER — Encounter: Admit: 2018-10-31 | Discharge: 2018-10-31 | Primary: Family

## 2018-10-31 MED ORDER — HYDROCHLOROTHIAZIDE 25 MG PO TAB
ORAL_TABLET | Freq: Every morning | ORAL | 3 refills | 28.00000 days | Status: DC
Start: 2018-10-31 — End: 2018-12-07

## 2018-11-22 ENCOUNTER — Ambulatory Visit: Admit: 2018-11-22 | Discharge: 2018-11-23 | Primary: Family

## 2018-11-22 ENCOUNTER — Encounter: Admit: 2018-11-22 | Discharge: 2018-11-22 | Primary: Family

## 2018-11-22 DIAGNOSIS — I10 Essential (primary) hypertension: Secondary | ICD-10-CM

## 2018-11-22 DIAGNOSIS — E119 Type 2 diabetes mellitus without complications: Secondary | ICD-10-CM

## 2018-11-22 DIAGNOSIS — E785 Hyperlipidemia, unspecified: Secondary | ICD-10-CM

## 2018-11-22 DIAGNOSIS — E1165 Type 2 diabetes mellitus with hyperglycemia: Secondary | ICD-10-CM

## 2018-11-22 DIAGNOSIS — Z720 Tobacco use: Secondary | ICD-10-CM

## 2018-11-22 DIAGNOSIS — T50905A Adverse effect of unspecified drugs, medicaments and biological substances, initial encounter: Secondary | ICD-10-CM

## 2018-11-22 DIAGNOSIS — I2510 Atherosclerotic heart disease of native coronary artery without angina pectoris: Secondary | ICD-10-CM

## 2018-11-22 DIAGNOSIS — E782 Mixed hyperlipidemia: Secondary | ICD-10-CM

## 2018-11-22 NOTE — Progress Notes
Date of Service: 11/22/2018    Adam Galloway is a 63 y.o. male.       HPI     It was a pleasure to see Adam Galloway today in my office today for his cardiovascular fellow followup.  As you may recall, Adam Galloway is a very pleasant 63 year old gentleman with a history of coronary artery disease with previous history of ischemic cardiomyopathy, hypertension, diabetes mellitus, and chronic kidney disease, who is here for his followup examination.  Fortunately, Adam Galloway has had no complaints of any chest discomfort.  He is physically active and works out on a regular basis.  Denies any chest discomfort or shortness of breath or dizziness or lightheadedness.     His last echo Doppler done in 2018 had revealed a normal LV function.     Continues to be physically active with no symptoms.     Blood pressures are stable.     Medications were reviewed.     His last lab workup was reviewed from May.  His creatinine remains stable around 1.5.     His lipid profile showed good numbers.     Unfortunately, Adam Galloway continues to smoke.  We have had this discussion with him pretty much on every visit.  Adam Galloway has no plans to quit smoking.  He does understand the risks of smoking, especially since he has already had an ST-elevation myocardial infarction and has stents in his heart.     No changes with his medications.  Follow up with me in 6 months unless there is any change in his symptoms.    (ZOX:[id number])             Vitals:    11/22/18 0839 11/22/18 0843   BP: 118/70 110/72   BP Source: Arm, Right Upper Arm, Left Upper   Pulse: 83    Weight: 88.5 kg (195 lb)    Height: 1.829 m (6')    PainSc: Zero      Body mass index is 26.45 kg/m???.     Past Medical History  Patient Active Problem List    Diagnosis Date Noted   ??? Acute shoulder pain due to trauma, right 02/01/2018   ??? 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 OSA (obstructive sleep apnea) 10/06/2017   ??? Asystole (HCC) 05/17/2016   ??? Essential hypertension 04/16/2015   ??? Dyslipidemia ??? DM (diabetes mellitus), type 2 (HCC)    ??? Tobacco abuse    ??? HLD (hyperlipidemia) 01/16/2008   ??? CAD (coronary artery disease) 01/16/2008     2009-Previous anterior wall myocardial infarction with complete recovery and status post PCI of the diagonal branch.  12/16/14- [facility]- STEMI- PCI- PLAD    [facility]16[facility]-   Echocardiogram- EF 50-55%, no focal left ventricular wall motion abnormalities     ??? STEMI (ST elevation myocardial infarction) (HCC) 01/16/2008   ??? Anxiety 01/16/2008   ??? Hyperglycemia 01/16/2008         Review of Systems   Constitution: Negative.   HENT: Negative.    Eyes: Negative.    Cardiovascular: Negative.    Respiratory: Negative.    Endocrine: Negative.    Hematologic/Lymphatic: Negative.    Skin: Negative.    Gastrointestinal: Negative.    Genitourinary: Negative.    Neurological: Negative.    Psychiatric/Behavioral: Negative.    Allergic/Immunologic: Negative.        Physical Exam  General Appearance: moderately over weight   Lips &???Oral Mucosa: no pallor or cyanosis   Neck Veins: neck veins  are flat, neck veins are not distended   Thyroid: no nodules, masses, tenderness or enlargement   Chest Inspection: chest is normal in appearance   Respiratory Effort: breathing comfortably, no respiratory distress   Auscultation/Percussion: lungs clear to auscultation, no rales or rhonchi, no wheezing   PMI: PMI not enlarged or displaced   Cardiac Rhythm: regular rhythm and normal rate   Cardiac Auscultation: S1, S2 normal, no rub, no gallop   Murmurs: no murmur   Carotid Arteries: normal carotid upstroke bilaterally, no bruit   Brachial Arteries: normal symmetric brachial pulses   Radial Arteries: normal symmetric radial pulses   Abdominal Aorta: no abdominal aortic bruit   Pedal Pulses: normal symmetric pedal pulses   Lower Extremity Edema: no lower extremity edema   Abdominal Exam: soft, non-tender, no masses, bowel sounds normal   Liver &???Spleen: no organomegaly Orientation: oriented to time, place and person???    Cardiovascular Studies  1. January 13, 2008: Cardiac catheterization secondary to an acute anterior wall   myocardial infarction. Reperfusion of LAD, high-grade lesion in the diagonal branch,   a 2.5 Xience stent placed there. Full report in the [facility].   2. January 16, 2008: Echo Doppler ejection fraction improved to 60 percent.   3. January 16, 2008: Renal duplex study. No evidence of any significant stenosis in   the renal arteries.   4. January 11, 2008: Exercise stress MPI. No evidence of fixed or reversible   perfusion defect, ejection fraction 51 percent.   5. October 2010, exercise stress test negative for any significant ischemia. Normal ejection fraction   6. November 04, 2012, regadenoson stress thallium showed no evidence of any significant ischemia. Ejection fraction at 50%.   7. December 17, 2014 cardiac catheterization showed previously placed stent in the LAD was patent. A proximal to mid LAD lesion of 80% had a 3 x 28 mm drug-eluting stent placed and post dilated with a 3 x 20 mm balloon. There was also a 60% lesion of the left PDA and 90% lesion of the nondominant right.   8. December 16, 2014 echo Doppler revealed a left ventricular function of 50% to 55%. No significant valvular abnormalities were noted.   9. May 17, 2016 echo Doppler. ???Left ventricular cavity size is normal. ???Moderately decreased systolic function with ejection fraction of 40-45%. ???No significant valvular abnormalities. ???Trace pericardial effusion.  10. May 17, 2016 cardiac catheterization. ???Moderate coronary artery disease involving the left circumflex artery as well as the posterior left ventricular and posterior descending arterial branch of the left circumflex artery. ???High-grade lesion of a small nondominant right coronary artery. Patent stents in the proximal left anterior descending artery and the proximal first diagonal vessel. ???Normal left ventricular end-diastolic pressures.  December 10, 2016 echo Doppler.  Left ventricle ejection fraction of 55%.  No significant Doppler abnormalities.    Problems Addressed Today  Encounter Diagnoses   Name Primary?   ??? Coronary artery disease involving native coronary artery of native heart without angina pectoris Yes   ??? Essential hypertension    ??? Mixed hyperlipidemia    ??? Type 2 diabetes mellitus with hyperglycemia, with long-term current use of insulin (HCC)    ??? Tobacco abuse        Assessment and Plan     1. ???Coronary artery disease with previous anterior wall myocardial function. ???Patient had a repeat cardiac catheterization secondary to a complication after his stress test in the office. ???Cardiac catheterization showed patent stents. ???Patient continues to  do well with no symptoms suggestive of angina.  2. ???Ischemic cardiomyopathy. ???Patient on optimal medications.  Recent echo Doppler revealed normal LV function.  3. ???Hypertension. ???Blood pressures are well controlled.  4. ???Dyslipidemia. ???Good lipid numbers.  5. ???Type 2 diabetes mellitus. ???Follows with his PCP.  6. ???History of smoking. ???Unfortunately patient continues to smoke. ???He has no plans to quit at this time.  7.  History of chronic kidney disease stage III.  Stable.  ???         Current Medications (including today's revisions)  ??? ALPRAZolam (XANAX) 0.5 mg tablet Take 0.5 mg by mouth as Needed.   ??? aspirin EC 81 mg tablet Take 81 mg by mouth daily. Take with food.   ??? atorvastatin (LIPITOR) 20 mg tablet Take 20 mg by mouth daily.   ??? calcium carbonate/vitamin D3 (VITAMIN D-3 PO) Take 1,000 mg by mouth daily.   ??? carvedilol (COREG) 12.5 mg tablet Take one tablet by mouth twice daily. Take with food.   ??? citalopram (CELEXA) 20 mg PO tablet Take  by mouth Daily.   ??? hydroCHLOROthiazide (HYDRODIURIL) 25 mg tablet TAKE 1 TABLET BY MOUTH EVERY MORNING   ??? insulin glargine (TOUJEO SOLOSTAR) 300 unit/mL (1.5 mL) injectable Inject 60 Units under the skin daily. ??? JARDIANCE 25 mg tablet Take 25 mg by mouth daily.   ??? lisinopriL (ZESTRIL) 40 mg tablet Take 40 mg by mouth daily.   ??? metFORMIN (GLUCOPHAGE) 500 mg tablet Take 2 tablets by mouth twice daily with meals.   ??? nitroglycerin (NITROSTAT) 0.4 mg tablet Place one tablet under tongue every 5 minutes as needed for Chest Pain.   ??? pantoprazole DR (PROTONIX) 40 mg tablet Take 40 mg by mouth daily.   ??? sildenafiL (VIAGRA) 25 mg tablet Take one tablet by mouth as Needed for Erectile dysfunction.

## 2018-11-29 ENCOUNTER — Encounter: Admit: 2018-11-29 | Discharge: 2018-11-29 | Primary: Family

## 2018-11-29 DIAGNOSIS — G4733 Obstructive sleep apnea (adult) (pediatric): Secondary | ICD-10-CM

## 2018-11-29 DIAGNOSIS — Z720 Tobacco use: Secondary | ICD-10-CM

## 2018-11-29 DIAGNOSIS — D45 Polycythemia vera: Secondary | ICD-10-CM

## 2018-11-29 DIAGNOSIS — D751 Secondary polycythemia: Secondary | ICD-10-CM

## 2018-11-29 NOTE — Progress Notes
Pts wife called and states he is feeling tired and needs to be seen and have his blood checked. Pt given an appt. For tomorrow with NP and labs ordered.

## 2018-11-30 ENCOUNTER — Encounter: Admit: 2018-11-30 | Discharge: 2018-11-30 | Primary: Family

## 2018-11-30 ENCOUNTER — Encounter: Admit: 2018-11-30 | Discharge: 2018-12-01 | Primary: Family

## 2018-11-30 DIAGNOSIS — T50905A Adverse effect of unspecified drugs, medicaments and biological substances, initial encounter: Secondary | ICD-10-CM

## 2018-11-30 DIAGNOSIS — Z720 Tobacco use: Secondary | ICD-10-CM

## 2018-11-30 DIAGNOSIS — D751 Secondary polycythemia: Secondary | ICD-10-CM

## 2018-11-30 DIAGNOSIS — F17210 Nicotine dependence, cigarettes, uncomplicated: Secondary | ICD-10-CM

## 2018-11-30 DIAGNOSIS — G4733 Obstructive sleep apnea (adult) (pediatric): Secondary | ICD-10-CM

## 2018-11-30 DIAGNOSIS — E785 Hyperlipidemia, unspecified: Secondary | ICD-10-CM

## 2018-11-30 DIAGNOSIS — E119 Type 2 diabetes mellitus without complications: Secondary | ICD-10-CM

## 2018-11-30 DIAGNOSIS — I2510 Atherosclerotic heart disease of native coronary artery without angina pectoris: Secondary | ICD-10-CM

## 2018-11-30 DIAGNOSIS — I10 Essential (primary) hypertension: Secondary | ICD-10-CM

## 2018-11-30 DIAGNOSIS — D45 Polycythemia vera: Secondary | ICD-10-CM

## 2018-11-30 LAB — IRON + BINDING CAPACITY + %SAT+ FERRITIN
Lab: 35 ug/dL — ABNORMAL LOW (ref 50–185)
Lab: 535 ug/dL — ABNORMAL HIGH (ref 270–380)
Lab: 7 % — ABNORMAL LOW (ref 28–42)

## 2018-11-30 LAB — COMPREHENSIVE METABOLIC PANEL
Lab: 0.5 mg/dL — ABNORMAL HIGH (ref 0.3–1.2)
Lab: 10 10*3/uL (ref 3–12)
Lab: 10 mg/dL — ABNORMAL LOW (ref >60)
Lab: 101 MMOL/L — ABNORMAL HIGH (ref 98–110)
Lab: 134 MMOL/L — ABNORMAL LOW (ref 137–147)
Lab: 147 mg/dL — ABNORMAL HIGH (ref 70–100)
Lab: 2 mg/dL — ABNORMAL HIGH (ref >60)
Lab: 22 U/L — ABNORMAL HIGH (ref 7–56)
Lab: 23 MMOL/L — ABNORMAL HIGH (ref 21–30)
Lab: 23 U/L (ref 7–40)
Lab: 34 mL/min — ABNORMAL LOW (ref >60)
Lab: 37 mg/dL — ABNORMAL HIGH (ref 7–25)
Lab: 4.5 g/dL (ref 3.5–5.0)
Lab: 41 mL/min — ABNORMAL LOW (ref >60)
Lab: 5.3 MMOL/L — ABNORMAL HIGH (ref 3.5–5.1)
Lab: 7.8 g/dL (ref 6.0–8.0)
Lab: 85 U/L (ref 25–110)

## 2018-11-30 LAB — CBC AND DIFF
Lab: 10 K/UL — ABNORMAL LOW (ref 4.5–11.0)
Lab: 6 M/UL — ABNORMAL HIGH (ref 4.4–5.5)

## 2018-11-30 NOTE — Patient Instructions
Tips for a Successful Donation  By following a few recommendations before, during and after your blood donation can help you make your donation experience as safe, successful and pleasant as possible.   Before Your Donation  Get a good night's sleep.   Drink an extra 16 oz. of water or nonalcoholic fluids before the donation.   Eat a healthy meal before your donation.     During Your Donation  Wear clothing with sleeves that can be raised above the elbow.   Let the person taking your blood know if you have a preferred arm and show them any good veins that have been used successfully in the past to draw blood.   Relax, listen to music, visit with others or read during the donation process.   Take the time to enjoy a drink of juice or water.        After Your Donation  Drink an extra four (8 ounce) glasses of liquids and avoid alcohol over the next 24 hours.   Remove the wrap bandage within the next hour.   Do not do any heavy lifting or vigorous exercise for the rest of the day.   If the needle site starts to bleed, apply pressure to it and raise your arm straight up for about 5-10 minutes or until bleeding stops.   If you experience dizziness or lightheadedness after donation, stop what you are doing and sit down or lie down until you feel better. Avoid performing any activity where fainting may lead to injury for at least 24 hours    excerpt from [email or web address].org

## 2018-11-30 NOTE — Assessment & Plan Note
Continues to smoke.  Not interested in quitting.  Plan to quit once his father is no longer living with him as he smokes and is not planning on quitting

## 2018-11-30 NOTE — Progress Notes
Date of Service: 11/30/2018               Reason for Visit:  Heme/Onc Care        Subjective:        History of Present Illness  Adam Galloway is a 63 y.o. male is a patient of Dr. Mitchell Heir who is here for follow up for his secondary erythrocytosis.  He feels like he is needing a phleb.  Has was in Dec 2019.  He reports increased fatigue, muscle aches, stomach aches and some diarrhea along with metallic taste in mouth.  No fever or chills, nor SOA.  These symptoms are common when he needs a phleb.  He has not had any lung cancer screening.  He is unaccompanied today.        Oncology History:  Dr. Hetty Blend???[place]???for polycythemia. ???He was admitted the early part of May to [facility] with abdominal pain which was thought to be secondary to gastritis, GERD, and may be some mild colonic thickening. ???    Social History     Socioeconomic History   ??? Marital status: Married     Spouse name: Not on file   ??? Number of children: Not on file   ??? Years of education: Not on file   ??? Highest education level: Not on file   Occupational History   ??? Not on file   Tobacco Use   ??? Smoking status: Current Every Day Smoker     Packs/day: 1.00     Years: 30.00     Pack years: 30.00     Types: Cigarettes   ??? Smokeless tobacco: Never Used   ??? Tobacco comment: has a nurse through [employer] that calls   Substance and Sexual Activity   ??? Alcohol use: No   ??? Drug use: No   ??? Sexual activity: Yes   Other Topics Concern   ??? Not on file   Social History Narrative   ??? Not on file     Medical History:   Diagnosis Date   ??? Adverse drug reaction    ??? CAD (coronary artery disease)    ??? DM (diabetes mellitus), type 2 (HCC)    ??? Dyslipidemia    ??? HTN (hypertension)    ??? Tobacco abuse           Review of Systems   Constitutional: Positive for appetite change and fatigue. Negative for chills, fever and unexpected weight change.   HENT: Negative.    Respiratory: Negative for cough, shortness of breath and wheezing. Cardiovascular: Negative for leg swelling.   Gastrointestinal: Positive for diarrhea and nausea.   Genitourinary: Negative.    Musculoskeletal: Positive for myalgias.   Neurological: Negative for dizziness, light-headedness and headaches.   Psychiatric/Behavioral: Negative.          Objective:         ??? ALPRAZolam (XANAX) 0.5 mg tablet Take 0.5 mg by mouth as Needed.   ??? aspirin EC 81 mg tablet Take 81 mg by mouth daily. Take with food.   ??? atorvastatin (LIPITOR) 20 mg tablet Take 20 mg by mouth daily.   ??? calcium carbonate/vitamin D3 (VITAMIN D-3 PO) Take 1,000 mg by mouth daily.   ??? carvedilol (COREG) 12.5 mg tablet Take one tablet by mouth twice daily. Take with food.   ??? citalopram (CELEXA) 20 mg PO tablet Take  by mouth Daily.   ??? hydroCHLOROthiazide (HYDRODIURIL) 25 mg tablet TAKE 1 TABLET  BY MOUTH EVERY MORNING   ??? insulin glargine (TOUJEO SOLOSTAR) 300 unit/mL (1.5 mL) injectable Inject 60 Units under the skin daily.   ??? JARDIANCE 25 mg tablet Take 25 mg by mouth daily.   ??? lisinopriL (ZESTRIL) 40 mg tablet Take 40 mg by mouth daily.   ??? metFORMIN (GLUCOPHAGE) 500 mg tablet Take 2 tablets by mouth twice daily with meals.   ??? nitroglycerin (NITROSTAT) 0.4 mg tablet Place one tablet under tongue every 5 minutes as needed for Chest Pain.   ??? pantoprazole DR (PROTONIX) 40 mg tablet Take 40 mg by mouth daily.   ??? sildenafiL (VIAGRA) 25 mg tablet Take one tablet by mouth as Needed for Erectile dysfunction.     Vitals:    11/30/18 1009 11/30/18 1010   BP: 98/76    Pulse: 84    Resp: 18    Temp: 36.6 ???C (97.8 ???F)    TempSrc: Oral Oral   SpO2: 97%    Weight: 89.1 kg (196 lb 6.4 oz)    Height: 182.9 cm (72)    PainSc: Zero Zero     Body mass index is 26.64 kg/m???.     Pain Score: Zero       Pain Addressed:  N/A      Labs:  CBC w/Diff    Lab Results   Component Value Date/Time    WBC 10.3 11/30/2018 10:00 AM    RBC 6.03 (H) 11/30/2018 10:00 AM    HGB 15.5 11/30/2018 10:00 AM    HCT 48.1 11/30/2018 10:00 AM MCV 79.7 (L) 11/30/2018 10:00 AM    MCH 25.7 (L) 11/30/2018 10:00 AM    MCHC 32.2 11/30/2018 10:00 AM    RDW 19.7 (H) 11/30/2018 10:00 AM    PLTCT 316 11/30/2018 10:00 AM    MPV 6.7 (L) 11/30/2018 10:00 AM    Lab Results   Component Value Date/Time    NEUT 63 11/30/2018 10:00 AM    ANC 6.50 11/30/2018 10:00 AM    LYMA 20 (L) 11/30/2018 10:00 AM    ALC 2.00 11/30/2018 10:00 AM    MONA 10 11/30/2018 10:00 AM    AMC 1.00 (H) 11/30/2018 10:00 AM    EOSA 6 (H) 11/30/2018 10:00 AM    AEC 0.60 (H) 11/30/2018 10:00 AM    BASA 1 11/30/2018 10:00 AM    ABC 0.10 11/30/2018 10:00 AM        Comprehensive Metabolic Profile    Lab Results   Component Value Date/Time    NA 136 07/24/2018    K 4.8 07/24/2018    CL 101 07/24/2018    CO2 25 07/24/2018    GAP 11 02/01/2018 01:16 PM    BUN 34 (H) 07/24/2018    CR 1.50 (H) 07/24/2018    GLU 141 07/24/2018    Lab Results   Component Value Date/Time    CA 9.9 07/24/2018    PO4 2.8 01/14/2008 04:10 AM    ALBUMIN 3.8 07/24/2018    TOTPROT 7.5 07/24/2018    ALKPHOS 87 07/24/2018    AST 20 07/24/2018    ALT 24 07/24/2018    TOTBILI 0.70 07/24/2018    GFR 44 (L) 02/01/2018 01:16 PM    GFRAA 54 (L) 02/01/2018 01:16 PM           Physical Exam  Vitals signs reviewed.   Constitutional:       General: He is not in acute distress.     Appearance: He is well-developed.  He is not diaphoretic.   HENT:      Head: Normocephalic and atraumatic.   Neck:      Musculoskeletal: Normal range of motion.   Cardiovascular:      Rate and Rhythm: Normal rate and regular rhythm.      Heart sounds: Normal heart sounds.   Pulmonary:      Effort: Pulmonary effort is normal.      Breath sounds: Normal breath sounds.   Abdominal:      General: Bowel sounds are normal.      Palpations: Abdomen is soft.      Tenderness: There is no abdominal tenderness.   Musculoskeletal: Normal range of motion.   Skin:     General: Skin is warm and dry.      Coloration: Skin is not pale.   Neurological: Mental Status: He is alert and oriented to person, place, and time.   Psychiatric:         Behavior: Behavior normal.         Thought Content: Thought content normal.         Judgment: Judgment normal.                 Assessment and Plan:         ICD-9-CM ICD-10-CM    1. Erythrocytosis 289.0 D75.1    2. 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 ???Goal is hct <45.  Last had phleb in 04/2018.  Hct is 48.1.  He is having more fatigue, muscle aches and metallic taste.  Will phleb today. BP is 98/76-instructed to drink plenty of fluid during phleb and after.  To let us know if any dizziness or light headedness.  Follow up in 3 mo.  Will call if need to be seen sooner.    If his stomach aches and diarrhea do not resolve in the next 1-2 weeks after phleb, he is instructed to follow up with his GI physician.  He agrees with plan.    Tobacco abuse  Continues to smoke.  Not interested in quitting.  Plan to quit once his father is no longer living with him as he smokes and is not planning on quitting.  He has a 30 pack year and meets criteria for lung cancer screening.  Discussed with pt the purpose and higher risk of lung cancer due to tobacco hx.  He is agreeable to low dose CT chest. Will order.  5 min on tobacco education/cessation done.           Patient verbalized understanding and all questions were answered to their satisfaction.  Follow up in 3 mo with provider.

## 2018-11-30 NOTE — Progress Notes
Patient reports fatigue and joint pain.  Approximately 500 ml phlebotomy tolerated without difficulty.  17 G to RAC x 1 attempt.  Replaced with PO fluids.  Patient denies dizziness.  Needle removed and pressure dressing applied.  Patient observed for 10 minutes and discharged in good condition.

## 2018-12-07 ENCOUNTER — Encounter: Admit: 2018-12-07 | Discharge: 2018-12-07 | Primary: Family

## 2018-12-07 MED ORDER — LISINOPRIL 20 MG PO TAB
20 mg | ORAL_TABLET | Freq: Every day | ORAL | 3 refills | Status: AC
Start: 2018-12-07 — End: ?

## 2018-12-07 NOTE — Telephone Encounter
12/07/2018 12:28 PM   Pt verbalized understanding of instructions below from [facility]. Pt states he is not having any chest pain or shortness of breath.    Tarri Abernethy, MD  Tivis Ringer, RN   Caller: Unspecified (Today, 9:45 AM)            Hold BP meds today. D/C HCTZ. Starting tomorrow if Systolic BP >883 then resume Coreg and decrease Lisinopril to 20 mg once a day. Continue to monitor BP.   Please make sure patient is not having any chest pain or increasing SOB..   Thanks.

## 2018-12-07 NOTE — Telephone Encounter
12/07/2018 9:46 AM   Pt called stating blood pressure has been low this am. First blood pressure this am 76/43. Pt was feeling lightheaded and dizzy during this time. Pt stated BP has been lower recently. Pt took morning BP meds of coreg 12.5mg , hydrochlorothiazide 25mg , and lisinopril 40mg . BP at 9:20am was 89/46. Pt took BP at 9:45am and BP currently is 117/64. Pt does not feel lightheaded or dizzy at this time.     Instructed pt to eat salty snack and drink plenty of fluids. Educated pt if he feels lightheaded or dizzy, sit down. Dangle feet over side of bed or chair before standing up. Pt verbalized understanding.     Routed to [facility] for recommendations.

## 2018-12-11 ENCOUNTER — Encounter: Admit: 2018-12-11 | Discharge: 2018-12-11 | Primary: Family

## 2018-12-30 ENCOUNTER — Encounter: Admit: 2018-12-30 | Discharge: 2018-12-30 | Primary: Family

## 2019-01-01 MED ORDER — CARVEDILOL 12.5 MG PO TAB
ORAL_TABLET | Freq: Two times a day (BID) | ORAL | 3 refills | 90.00000 days | Status: AC
Start: 2019-01-01 — End: ?

## 2019-01-11 ENCOUNTER — Encounter: Admit: 2019-01-11 | Discharge: 2019-01-11 | Primary: Family

## 2019-01-11 DIAGNOSIS — D45 Polycythemia vera: Secondary | ICD-10-CM

## 2019-01-11 DIAGNOSIS — D751 Secondary polycythemia: Secondary | ICD-10-CM

## 2019-01-11 NOTE — Telephone Encounter
Pt wife called to report that pt has been experiencing "metallic taste in mouth" and she feels like he is due for labs. Attempt to return call to pt to discuss further. No answer. Pt due for follow up, labs and scheduling placed.

## 2019-01-17 ENCOUNTER — Encounter: Admit: 2019-01-17 | Discharge: 2019-01-17 | Primary: Family

## 2019-01-17 DIAGNOSIS — D45 Polycythemia vera: Secondary | ICD-10-CM

## 2019-01-17 DIAGNOSIS — D751 Secondary polycythemia: Principal | ICD-10-CM

## 2019-01-17 LAB — CBC AND DIFF
Lab: 0.1 10*3/uL (ref 0–0.20)
Lab: 0.5 10*3/uL — ABNORMAL HIGH (ref 0–0.45)
Lab: 0.8 10*3/uL (ref 0–0.80)
Lab: 1 % — ABNORMAL LOW (ref >60)
Lab: 1.5 10*3/uL (ref 1.0–4.8)
Lab: 10 10*3/uL (ref 4.5–11.0)
Lab: 14 g/dL — ABNORMAL HIGH (ref 13.5–16.5)
Lab: 15 % — ABNORMAL LOW (ref 24–44)
Lab: 17 % — ABNORMAL HIGH (ref 11–15)
Lab: 243 K/UL (ref 150–400)
Lab: 26 pg (ref 26–34)
Lab: 32 g/dL (ref 32.0–36.0)
Lab: 44 % — ABNORMAL HIGH (ref 40–50)
Lab: 5 % — ABNORMAL LOW (ref >60)
Lab: 5.5 M/UL — ABNORMAL HIGH (ref 4.4–5.5)
Lab: 6.5 FL — ABNORMAL LOW (ref 7–11)
Lab: 7.4 10*3/uL — ABNORMAL HIGH (ref 1.8–7.0)
Lab: 71 % (ref 41–77)
Lab: 8 % (ref 4–12)
Lab: 81 FL (ref 80–100)

## 2019-01-17 LAB — COMPREHENSIVE METABOLIC PANEL
Lab: 132 MMOL/L — ABNORMAL LOW (ref 137–147)
Lab: 4.8 MMOL/L — ABNORMAL LOW (ref 3.5–5.1)

## 2019-01-17 LAB — IRON + BINDING CAPACITY + %SAT+ FERRITIN
Lab: 36 ug/dL — ABNORMAL LOW (ref 50–185)
Lab: 538 ug/dL — ABNORMAL HIGH (ref 270–380)

## 2019-01-17 LAB — VITAMIN B12: Lab: 225 pg/mL (ref 180–914)

## 2019-08-27 NOTE — Progress Notes
Patient: Adam Galloway, DOB: 10/11/1955    Requesting most recent blood work (including most recent lipid panel) and/or testing, to be faxed to Dr. [name].    Please fax to  Attention: Dr. [name] at the [facility]    Fax number [phone]  Thank you.

## 2019-08-27 NOTE — Progress Notes
Lab from PCP, entered for historical purpose, from 4/17 and 07/10/19.   Upcoming OV

## 2019-09-07 MED ORDER — AMLODIPINE 5 MG PO TAB
5 mg | ORAL_TABLET | Freq: Every day | ORAL | 3 refills | Status: AC
Start: 2019-09-07 — End: ?

## 2019-09-14 ENCOUNTER — Ambulatory Visit: Admit: 2019-09-14 | Discharge: 2019-09-15 | Payer: BC Managed Care – PPO | Primary: Family

## 2019-09-14 ENCOUNTER — Encounter: Admit: 2019-09-14 | Discharge: 2019-09-14 | Payer: BC Managed Care – PPO | Primary: Family

## 2019-09-14 NOTE — Progress Notes
Pt came in for BP check today. BP left: 142/88, BP right: 138/92 and pulse: 73. Pt reports BPs 120s/70s at home since starting the amlodipine and stated he is feeling well. He will continue to monitor his BP and knows to call with questions/concerns.       09/07/19 4/30 OV visit:    Recommendations are as follows:  1. Since he is off the hydrochlorothiazide, we will start him on amlodipine 5 mg 1 tablet daily for blood pressure control.  2. Recheck blood pressures in 1 week in the office.  However, patient will continue to follow his blood pressures at home.  I have advised him that the goal is to keep a systolic blood pressure of 130 or less, diastolic pressure of 85 or less.  3. Since he is not having any other complaints of chest pains or shortness of breath, the rest of the medications have been left unchanged.  4. He is already doing very well with weight loss and exercise.  We have encouraged him to continue doing that.  5. Followup with me in 6 months or if there is any change in symptoms.  We will check his blood pressure in 1 week in our clinic.   ?

## 2020-01-16 ENCOUNTER — Encounter: Admit: 2020-01-16 | Discharge: 2020-01-16 | Payer: BC Managed Care – PPO | Primary: Family

## 2020-01-21 ENCOUNTER — Encounter: Admit: 2020-01-21 | Discharge: 2020-01-21 | Payer: BC Managed Care – PPO | Primary: Family

## 2020-01-21 MED ORDER — CARVEDILOL 12.5 MG PO TAB
ORAL_TABLET | Freq: Two times a day (BID) | ORAL | 3 refills | 90.00000 days | Status: AC
Start: 2020-01-21 — End: ?

## 2020-02-19 ENCOUNTER — Encounter: Admit: 2020-02-19 | Discharge: 2020-02-19 | Payer: BC Managed Care – PPO | Primary: Family

## 2020-02-19 MED ORDER — NITROGLYCERIN 0.4 MG SL SUBL
.4 mg | ORAL_TABLET | SUBLINGUAL | 1 refills | 9.00000 days | Status: AC | PRN
Start: 2020-02-19 — End: ?

## 2020-02-19 NOTE — Telephone Encounter
Pt's wife called and left a message that he has no refill of his nitro. I called her back and asked her if he was doing ok and she said yes his nitro is just outdated. I told her I sent the script in for him. She verbalized understanding.

## 2020-03-13 ENCOUNTER — Encounter: Admit: 2020-03-13 | Discharge: 2020-03-13 | Payer: BC Managed Care – PPO | Primary: Family

## 2020-03-13 NOTE — Progress Notes
Adam Galloway, Jun 28, 1955 has an appointment with Dr. [name] on 03/18/20.    Please send recent lab results for continuity of care.    Thank you,   [name]    Phone: [phone]  Fax: [phone]

## 2020-06-30 MED ORDER — AMLODIPINE 5 MG PO TAB
5 mg | ORAL_TABLET | Freq: Every day | ORAL | 3 refills | Status: AC
Start: 2020-06-30 — End: ?

## 2020-06-30 MED ORDER — NITROGLYCERIN 0.4 MG SL SUBL
.4 mg | ORAL_TABLET | SUBLINGUAL | 3 refills | 9.00000 days | Status: AC | PRN
Start: 2020-06-30 — End: ?

## 2020-06-30 NOTE — Telephone Encounter
06/30/2020 10:13 AM   Received refill requests for nitroglycerin and amlodipine.   Protocol passed for nitroglycerin. Refill completed.  Pt has upcoming appointment in March - will complete BP at that time. Refill completed for amlodipine.

## 2020-07-02 ENCOUNTER — Encounter: Admit: 2020-07-02 | Discharge: 2020-07-02 | Payer: BC Managed Care – PPO | Primary: Family

## 2020-07-02 MED ORDER — AMLODIPINE 5 MG PO TAB
ORAL_TABLET | Freq: Every day | 0 refills | Status: AC
Start: 2020-07-02 — End: ?

## 2020-07-02 MED ORDER — NITROGLYCERIN 0.4 MG SL SUBL
ORAL_TABLET | 11 refills | PRN
Start: 2020-07-02 — End: ?

## 2020-07-09 ENCOUNTER — Encounter: Admit: 2020-07-09 | Discharge: 2020-07-09 | Payer: BC Managed Care – PPO | Primary: Family

## 2020-07-09 NOTE — Progress Notes
Adam Galloway, [birth year] has an appointment with Dr. Murvin Natal on 07/11/20.    Please send recent lab results for continuity of care.    Thank you,   Whalen Trompeter    Phone: [phone]  Fax: [phone]

## 2020-07-10 ENCOUNTER — Encounter: Admit: 2020-07-10 | Discharge: 2020-07-10 | Payer: BC Managed Care – PPO | Primary: Family

## 2020-07-11 ENCOUNTER — Ambulatory Visit: Admit: 2020-07-11 | Discharge: 2020-07-12 | Payer: MEDICARE | Primary: Family

## 2020-07-11 ENCOUNTER — Encounter: Admit: 2020-07-11 | Discharge: 2020-07-11 | Payer: MEDICARE | Primary: Family

## 2020-07-11 ENCOUNTER — Encounter: Admit: 2020-07-11 | Discharge: 2020-07-11 | Payer: BC Managed Care – PPO | Primary: Family

## 2020-07-11 DIAGNOSIS — E119 Type 2 diabetes mellitus without complications: Secondary | ICD-10-CM

## 2020-07-11 DIAGNOSIS — Z87891 Personal history of nicotine dependence: Secondary | ICD-10-CM

## 2020-07-11 DIAGNOSIS — I10 Essential (primary) hypertension: Secondary | ICD-10-CM

## 2020-07-11 DIAGNOSIS — E782 Mixed hyperlipidemia: Secondary | ICD-10-CM

## 2020-07-11 DIAGNOSIS — T50905A Adverse effect of unspecified drugs, medicaments and biological substances, initial encounter: Secondary | ICD-10-CM

## 2020-07-11 DIAGNOSIS — E1165 Type 2 diabetes mellitus with hyperglycemia: Secondary | ICD-10-CM

## 2020-07-11 DIAGNOSIS — I2510 Atherosclerotic heart disease of native coronary artery without angina pectoris: Secondary | ICD-10-CM

## 2020-07-11 DIAGNOSIS — Z720 Tobacco use: Secondary | ICD-10-CM

## 2020-07-11 DIAGNOSIS — E785 Hyperlipidemia, unspecified: Secondary | ICD-10-CM

## 2020-07-11 NOTE — Patient Instructions
Thank you for visiting our office today.   We recommend follow-up with our office in 6 months.   Please call [phone] in 1 month to schedule the office visit Dr. Murvin Natal ordered.     Please complete the following orders:     Check your labs at your earliest convenience. You do need to be fasting when lab is drawn. (Nothing except water and prescription medications for 12 hrs before blood is drawn.)    Schedule the CT lung screening, call [phone] to schedule.     Take your medications as ordered.   Check your list with what you have on hand at home.   Should you have any additional questions or concerns, please message me through MyChart or call the office.    Cardiovascular Medicine Team   Clinic phone: [phone]

## 2020-07-12 DIAGNOSIS — Z720 Tobacco use: Secondary | ICD-10-CM

## 2020-07-25 ENCOUNTER — Encounter: Admit: 2020-07-25 | Discharge: 2020-07-25 | Payer: MEDICARE | Primary: Family

## 2020-07-25 MED ORDER — AMLODIPINE 5 MG PO TAB
ORAL_TABLET | Freq: Every day | 3 refills | Status: AC
Start: 2020-07-25 — End: ?

## 2020-07-30 ENCOUNTER — Encounter: Admit: 2020-07-30 | Discharge: 2020-07-30 | Payer: MEDICARE | Primary: Family

## 2020-07-30 NOTE — Telephone Encounter
07/30/2020 2:56 PM    Received a VM from pt. [facility] reporting that pt. Needed a rx on his Ntg. Called pt. To verify why he needed a refill. Pt. Reports that he does not need a refill. He has not used the bottle he got in feb. Encouraged to call back with any questions or concerns.

## 2020-11-21 ENCOUNTER — Encounter: Admit: 2020-11-21 | Discharge: 2020-11-21 | Payer: MEDICARE | Primary: Family

## 2020-11-21 MED ORDER — CARVEDILOL 12.5 MG PO TAB
ORAL_TABLET | Freq: Two times a day (BID) | ORAL | 3 refills | 90.00000 days | Status: AC
Start: 2020-11-21 — End: ?

## 2021-01-22 ENCOUNTER — Encounter: Admit: 2021-01-22 | Discharge: 2021-01-22 | Payer: MEDICARE | Primary: Family

## 2021-01-22 NOTE — Progress Notes
Damareon L.Pinette, 1956/03/15 has an appointment with Dr. Murvin Natal on 01/28/21.    Please send recent lab results for continuity of care.    Thank you,   Tauheedah Bok    Phone: [phone]  Fax: [phone]

## 2021-01-26 ENCOUNTER — Encounter: Admit: 2021-01-26 | Discharge: 2021-01-26 | Payer: MEDICARE | Primary: Family

## 2021-03-02 ENCOUNTER — Encounter: Admit: 2021-03-02 | Discharge: 2021-03-02 | Payer: MEDICARE | Primary: Family

## 2021-03-02 NOTE — Progress Notes
Adam Galloway, 02/20/1957 has an appointment with Dr. Murvin Natal on 03/09/21.    Please send recent lab results for continuity of care.    Thank you,   [name]    Phone: [phone]  Fax: [phone]

## 2021-03-04 ENCOUNTER — Encounter: Admit: 2021-03-04 | Discharge: 2021-03-04 | Payer: MEDICARE | Primary: Family

## 2021-03-09 ENCOUNTER — Encounter: Admit: 2021-03-09 | Discharge: 2021-03-09 | Payer: MEDICARE | Primary: Family

## 2021-05-12 ENCOUNTER — Encounter: Admit: 2021-05-12 | Discharge: 2021-05-12 | Payer: MEDICARE | Primary: Family

## 2021-05-12 MED ORDER — CARVEDILOL 12.5 MG PO TAB
12.5 mg | ORAL_TABLET | Freq: Two times a day (BID) | ORAL | 0 refills | 90.00000 days | Status: AC
Start: 2021-05-12 — End: ?

## 2021-05-26 ENCOUNTER — Encounter: Admit: 2021-05-26 | Discharge: 2021-05-26 | Payer: MEDICARE | Primary: Family

## 2021-05-26 MED ORDER — AMLODIPINE 5 MG PO TAB
ORAL_TABLET | Freq: Every day | 0 refills | Status: AC
Start: 2021-05-26 — End: ?

## 2021-05-26 NOTE — Telephone Encounter
Mychart msg sent to patient as he is overdue for appt. Refill approved for 90 days.

## 2021-06-11 ENCOUNTER — Encounter: Admit: 2021-06-11 | Discharge: 2021-06-11 | Payer: MEDICARE | Primary: Family

## 2021-06-11 NOTE — Progress Notes
Adam Galloway, July 22, 1955 has an appointment with Dr. Murvin Natal on 06/16/21.    Please send recent lab results for continuity of care.    Thank you,   Kim Oki    Phone: [phone]  Fax: [phone]

## 2021-06-16 ENCOUNTER — Encounter: Admit: 2021-06-16 | Discharge: 2021-06-16 | Payer: MEDICARE | Primary: Family

## 2021-06-16 ENCOUNTER — Ambulatory Visit: Admit: 2021-06-16 | Discharge: 2021-06-16 | Payer: MEDICARE | Primary: Family

## 2021-06-16 DIAGNOSIS — E1165 Type 2 diabetes mellitus with hyperglycemia: Secondary | ICD-10-CM

## 2021-06-16 DIAGNOSIS — I2510 Atherosclerotic heart disease of native coronary artery without angina pectoris: Secondary | ICD-10-CM

## 2021-06-16 DIAGNOSIS — I10 Essential (primary) hypertension: Secondary | ICD-10-CM

## 2021-06-16 DIAGNOSIS — Z720 Tobacco use: Secondary | ICD-10-CM

## 2021-06-16 DIAGNOSIS — N182 Chronic kidney disease, stage 2 (mild): Secondary | ICD-10-CM

## 2021-06-16 DIAGNOSIS — T50905A Adverse effect of unspecified drugs, medicaments and biological substances, initial encounter: Secondary | ICD-10-CM

## 2021-06-16 DIAGNOSIS — E119 Type 2 diabetes mellitus without complications: Secondary | ICD-10-CM

## 2021-06-16 DIAGNOSIS — E785 Hyperlipidemia, unspecified: Secondary | ICD-10-CM

## 2021-06-16 MED ORDER — ROSUVASTATIN 20 MG PO TAB
20 mg | ORAL_TABLET | Freq: Every day | ORAL | 1 refills | 90.00000 days | Status: AC
Start: 2021-06-16 — End: ?

## 2021-06-16 NOTE — Patient Instructions
Thank you for visiting our office today.   We recommend follow-up with our office in 6 months.   Please call [phone] in 1 month to schedule the office visit Dr. Murvin Natal ordered.     Please complete the following orders:     Start taking Crestor    Check your labs in 3 months, You do need to be fasting when lab is drawn. (Nothing except water and prescription medications for 12 hrs before blood is drawn.) We will mail orders in May.     Take your medications as ordered.   Check your list with what you have on hand at home.   Should you have any additional questions or concerns, please message me through MyChart or call the office.    Cardiovascular Medicine Team   Clinic phone: [phone]

## 2021-07-01 ENCOUNTER — Encounter: Admit: 2021-07-01 | Discharge: 2021-07-01 | Payer: MEDICARE | Primary: Family

## 2021-07-01 MED ORDER — CARVEDILOL 12.5 MG PO TAB
ORAL_TABLET | ORAL | 11 refills | 90.00000 days | Status: AC
Start: 2021-07-01 — End: ?

## 2021-09-09 ENCOUNTER — Encounter: Admit: 2021-09-09 | Discharge: 2021-09-09 | Payer: MEDICARE | Primary: Family

## 2021-09-09 NOTE — Progress Notes
09/09/2021 7:28 AM    Mailed lab letter and orders to pt. Home address.

## 2021-09-23 ENCOUNTER — Encounter: Admit: 2021-09-23 | Discharge: 2021-09-23 | Payer: MEDICARE | Primary: Family

## 2021-09-23 DIAGNOSIS — I2510 Atherosclerotic heart disease of native coronary artery without angina pectoris: Secondary | ICD-10-CM

## 2021-09-23 DIAGNOSIS — I10 Essential (primary) hypertension: Secondary | ICD-10-CM

## 2021-09-23 DIAGNOSIS — E785 Hyperlipidemia, unspecified: Secondary | ICD-10-CM

## 2021-09-23 LAB — COMPREHENSIVE METABOLIC PANEL
BLD UREA NITROGEN: 20
CALCIUM: 9.8
CHLORIDE: 104
CO2: 24
CREATININE: 1.4 — AB
GLUCOSE,PANEL: 100
POTASSIUM: 4.7
SODIUM: 137

## 2021-09-23 LAB — LIPID PROFILE
CHOLESTEROL: 105
HDL: 34 — AB
LDL: 46
TRIGLYCERIDES: 128 — AB
VLDL: 26

## 2021-09-23 NOTE — Telephone Encounter
09/23/2021 10:06 AM    Spoke to pt. On the phone. Reports that he did requested labs 2 weeks ago at [facility]. Will request records.

## 2021-09-23 NOTE — Telephone Encounter
Patient: Adam Galloway, DOB: Dec 15, 1955    Requesting most recent: ( 09/01/21 -Present Day)    -CBC  -BMP/CMP  -TSH  -FASTING LIPID PANEL  -HgbA1c  -Magnesium      Please fax to  Attention: Dr. Murvin Natal at the [facility]    Fax number [phone]  Thank you.

## 2021-09-25 ENCOUNTER — Encounter: Admit: 2021-09-25 | Discharge: 2021-09-25 | Payer: MEDICARE | Primary: Family

## 2021-09-25 NOTE — Telephone Encounter
09/25/2021 4:35 PM Adam Galloway w/ Dr Patrecia Pace, Ph [phone] x [phone], Fx [phone]  Called.   Pt is scheduled for 10/30/21 for Rt carpal tunnel release.   They are requesting cardiac clearance and "recommendation on low dose aspirin"    Last OV 06/2021  Dx CAD w/ PCI, last cath 2018    [facility] until 5/30, will review w/ him at that time

## 2021-10-08 ENCOUNTER — Encounter: Admit: 2021-10-08 | Discharge: 2021-10-08 | Payer: MEDICARE | Primary: Family

## 2021-10-13 ENCOUNTER — Encounter: Admit: 2021-10-13 | Discharge: 2021-10-13 | Payer: MEDICARE | Primary: Family

## 2021-10-13 NOTE — Telephone Encounter
10/13/2021 3:19 PM   mychart msg sent to patient.    Adam Abernethy, MD  [facility] Nurse Gen Card Team [place]  Yes the creatinine is stable. Lipids look good.

## 2021-10-16 ENCOUNTER — Encounter: Admit: 2021-10-16 | Discharge: 2021-10-16 | Payer: MEDICARE | Primary: Family

## 2021-10-16 MED ORDER — AMLODIPINE 5 MG PO TAB
5 mg | ORAL_TABLET | Freq: Every day | ORAL | 2 refills | Status: AC
Start: 2021-10-16 — End: ?

## 2021-10-19 ENCOUNTER — Encounter: Admit: 2021-10-19 | Discharge: 2021-10-19 | Payer: MEDICARE | Primary: Family

## 2021-10-19 NOTE — Telephone Encounter
Adam Galloway with Dr. Desiree Hane is calling to get cardiac clearance and guidance with his Asa '81mg'$ . He is having a right carpel tunnel release 10/30/21. Call back [phone] ext [phone] and fax [phone] Dr. Murvin Natal is not available until Wed or Thurs this week and Dr Curly Shores is also out of the office will send to Dr. Murvin Natal to review when he is back later this week.

## 2021-10-31 ENCOUNTER — Encounter: Admit: 2021-10-31 | Discharge: 2021-10-31 | Payer: MEDICARE | Primary: Family

## 2021-11-03 ENCOUNTER — Encounter: Admit: 2021-11-03 | Discharge: 2021-11-03 | Payer: MEDICARE | Primary: Family

## 2021-11-03 NOTE — Telephone Encounter
Adam Galloway with Dr. Trula Ore office called to say pt's surgery was cancelled last week due to high potassium 6.2 she wanted to know what Dr. Murvin Natal recommends. I reviewed with Dr. Murvin Natal and he said PCP should manage as we aren't giving him anything to make it high. He said they should repeat the potassium to make sure that was an accurate reading. She will call PCP and pt to do that. She said pt had no complaints. Her call back [phone]

## 2021-12-03 ENCOUNTER — Encounter: Admit: 2021-12-03 | Discharge: 2021-12-03 | Payer: MEDICARE | Primary: Family

## 2021-12-03 MED ORDER — ROSUVASTATIN 20 MG PO TAB
20 mg | ORAL_TABLET | Freq: Every day | ORAL | 1 refills | 90.00000 days | Status: AC
Start: 2021-12-03 — End: ?

## 2021-12-18 ENCOUNTER — Encounter: Admit: 2021-12-18 | Discharge: 2021-12-18 | Payer: MEDICARE | Primary: Family

## 2021-12-18 NOTE — Progress Notes
Adam Galloway, June 30, 1955 has an appointment with Dr. Murvin Natal on 12/28/21.    Please send recent lab results for continuity of care.    Thank you,   Ariona Deschene    Phone: [phone]  Fax: [phone]

## 2021-12-28 ENCOUNTER — Encounter: Admit: 2021-12-28 | Discharge: 2021-12-28 | Payer: MEDICARE | Primary: Family

## 2021-12-28 ENCOUNTER — Ambulatory Visit: Admit: 2021-12-28 | Discharge: 2021-12-29 | Payer: MEDICARE | Primary: Family

## 2021-12-28 DIAGNOSIS — E119 Type 2 diabetes mellitus without complications: Secondary | ICD-10-CM

## 2021-12-28 DIAGNOSIS — Z720 Tobacco use: Secondary | ICD-10-CM

## 2021-12-28 DIAGNOSIS — T50905A Adverse effect of unspecified drugs, medicaments and biological substances, initial encounter: Secondary | ICD-10-CM

## 2021-12-28 DIAGNOSIS — R0609 Other forms of dyspnea: Secondary | ICD-10-CM

## 2021-12-28 DIAGNOSIS — I2510 Atherosclerotic heart disease of native coronary artery without angina pectoris: Secondary | ICD-10-CM

## 2021-12-28 DIAGNOSIS — I10 Essential (primary) hypertension: Secondary | ICD-10-CM

## 2021-12-28 DIAGNOSIS — E1165 Type 2 diabetes mellitus with hyperglycemia: Secondary | ICD-10-CM

## 2021-12-28 DIAGNOSIS — E785 Hyperlipidemia, unspecified: Secondary | ICD-10-CM

## 2021-12-28 DIAGNOSIS — N182 Chronic kidney disease, stage 2 (mild): Secondary | ICD-10-CM

## 2021-12-28 NOTE — Patient Instructions
Thank you for visiting our office today.   We recommend follow-up with our office in 6 months.   Please call [phone] if you need to re-schedule the office visit Dr. Murvin Natal ordered.     Please complete the following orders:     -Ideally blood pressure should be consistently less than 135/85. If your blood pressures are running higher than this at home please call our nursing line to discuss.     Take your medications as ordered.   Check your list with what you have on hand at home.   Should you have any additional questions or concerns, please message me through MyChart or call the office.    Cardiovascular Medicine Team   Clinic phone: [phone]

## 2022-04-27 ENCOUNTER — Encounter: Admit: 2022-04-27 | Discharge: 2022-04-27 | Payer: MEDICARE | Primary: Family

## 2022-04-27 MED ORDER — ROSUVASTATIN 20 MG PO TAB
20 mg | ORAL_TABLET | Freq: Every day | ORAL | 3 refills | 90.00000 days | Status: AC
Start: 2022-04-27 — End: ?

## 2022-05-04 ENCOUNTER — Encounter: Admit: 2022-05-04 | Discharge: 2022-05-04 | Payer: MEDICARE | Primary: Family

## 2022-05-04 MED ORDER — CARVEDILOL 12.5 MG PO TAB
12.5 mg | ORAL_TABLET | Freq: Two times a day (BID) | ORAL | 11 refills | 90.00000 days | Status: AC
Start: 2022-05-04 — End: ?

## 2022-05-27 ENCOUNTER — Encounter: Admit: 2022-05-27 | Discharge: 2022-05-27 | Payer: MEDICARE | Primary: Family

## 2022-05-27 MED ORDER — AMLODIPINE 5 MG PO TAB
5 mg | ORAL_TABLET | Freq: Every day | ORAL | 1 refills | Status: AC
Start: 2022-05-27 — End: ?

## 2022-06-22 ENCOUNTER — Encounter: Admit: 2022-06-22 | Discharge: 2022-06-22 | Payer: MEDICARE | Primary: Family

## 2022-06-22 NOTE — Progress Notes
Shashwat L. Satre, 1956/04/24 has an appointment with Dr. Murvin Natal on 06/30/22.    Please send recent lab results for continuity of care.    Thank you,   Renu Asby    Phone: [phone]  Fax: [phone]

## 2022-06-23 ENCOUNTER — Encounter: Admit: 2022-06-23 | Discharge: 2022-06-23 | Payer: MEDICARE | Primary: Family

## 2022-06-24 ENCOUNTER — Encounter: Admit: 2022-06-24 | Discharge: 2022-06-24 | Payer: MEDICARE | Primary: Family

## 2022-06-30 ENCOUNTER — Encounter: Admit: 2022-06-30 | Discharge: 2022-06-30 | Payer: MEDICARE | Primary: Family

## 2022-06-30 ENCOUNTER — Ambulatory Visit: Admit: 2022-06-30 | Discharge: 2022-07-01 | Payer: MEDICARE | Primary: Family

## 2022-06-30 DIAGNOSIS — I10 Essential (primary) hypertension: Secondary | ICD-10-CM

## 2022-06-30 DIAGNOSIS — I2510 Atherosclerotic heart disease of native coronary artery without angina pectoris: Secondary | ICD-10-CM

## 2022-06-30 DIAGNOSIS — E119 Type 2 diabetes mellitus without complications: Secondary | ICD-10-CM

## 2022-06-30 DIAGNOSIS — Z720 Tobacco use: Secondary | ICD-10-CM

## 2022-06-30 DIAGNOSIS — T50905A Adverse effect of unspecified drugs, medicaments and biological substances, initial encounter: Secondary | ICD-10-CM

## 2022-06-30 DIAGNOSIS — E785 Hyperlipidemia, unspecified: Secondary | ICD-10-CM

## 2022-06-30 MED ORDER — AMLODIPINE 10 MG PO TAB
10 mg | ORAL_TABLET | Freq: Every day | ORAL | 3 refills | Status: AC
Start: 2022-06-30 — End: ?

## 2022-06-30 NOTE — Progress Notes
Date of Service: 06/30/2022    Adam Galloway is a 67 y.o. male.       HPI       It was a pleasure to see Adam Galloway in the office today for his cardiovascular followup.    Adam Galloway is a very pleasant 67 year old gentleman with a history of coronary artery disease with a remote coronary intervention of the left anterior descending artery secondary to an anterior wall myocardial infarction.  This was done in 2009.  Please repeat cardiac catheterization in 2016 showed that the previously placed stent in the LAD was patent.  He had a high-grade lesion of the mid LAD.  He had another stent placed in 2016.  This was a 3 x 28 drug-eluting stent.  There was a moderate disease of the left PDA and a 90% lesion of the nondominant right coronary artery.  Repeat cardiac catheterization in 2018 revealed patent stents in the left and descending artery as well as in the diagonal branch and the rest of the coronary anatomy appears unchanged.    Today in the clinic he denies any chest discomfort.  Shortness of breath is about stable.  He is retired but still working 2 days a week part-time.  He occasionally gets lightheaded if he gets up too fast.  Unfortunately has continued to smoke a pack of cigarettes a day with no plans to quit.  Has been noticing his blood pressures at home are elevated.    Today in the clinic blood pressures are high.  Recheck pressure I got was 146/90 mmHg.  Expiratory wheezing was once again noted.  He is following his CT chest for surveillance because of his smoking.  Apparently has been stable.    I reviewed his lab workup.  There is no fasting lipid profile the patient insists that this was also done at the same time.  Will call the lab to see if we can get those results.  If the lipid profile was not then we will have him recheck it.    Today's EKG shows some nonspecific ST-T changes in the inferior leads as compared to patient's previous electrocardiogram.  Patient however denies any chest discomfort. Shortness of breath he says is unchanged.  Will get an echocardiogram to rule any wall motion abnormality and make sure the LV function is still normal.    Recommendations today are as follows:  1.  I have increased his amlodipine from 5 mg once a day to 10 mg once a day.  3.  Patient will continue to monitor blood pressures at home.  Our goal is to get a systolic blood pressure 130 or less and diastolic pressures of 85 or less.  3.  Patient advised to make sure that he drinks plenty of oral fluids and keep himself well-hydrated.  4.  Change positions very slowly.    5.  Smoking cessation as always was again discussed today.  No plans to quit.  6.  Will schedule an echo Doppler because there are some nonspecific EKG changes.  No is not complaining of any chest discomfort.    Total time spent with Adam Galloway including medication changes, going over my recommendations and counseling was 35 minutes in the clinic.             Vitals:    06/30/22 0757 06/30/22 0804   BP: (!) 140/94 (!) 138/92   BP Source: Arm, Right Upper Arm, Left Upper   Pulse: 77    PainSc: Zero  Weight: 87.1 kg (192 lb 1.6 oz)    Height: 182.9 cm (6')      Body mass index is 26.05 kg/m?Marland Kitchen     Past Medical History  Patient Active Problem List    Diagnosis Date Noted    Acute shoulder pain due to trauma, right 02/01/2018    Erythrocytosis 11/09/2017    OSA (obstructive sleep apnea) 10/06/2017    Asystole (HCC) 05/17/2016    Essential hypertension 04/16/2015    Dyslipidemia     DM (diabetes mellitus), type 2 (HCC)     Tobacco abuse     HLD (hyperlipidemia) 01/16/2008    CAD (coronary artery disease) 01/16/2008     2009-Previous anterior wall myocardial infarction with complete recovery and status post PCI of the diagonal branch.  12/16/14- [facility]- STEMI- PCI- PLAD    8/8/16[facility]-   Echocardiogram- EF 50-55%, no focal left ventricular wall motion abnormalities      STEMI (ST elevation myocardial infarction) (HCC) 01/16/2008    Anxiety 01/16/2008 Hyperglycemia 01/16/2008         Review of Systems   Constitutional: Negative.   HENT: Negative.     Eyes: Negative.    Cardiovascular: Negative.    Respiratory: Negative.     Endocrine: Negative.    Hematologic/Lymphatic: Negative.    Skin: Negative.    Gastrointestinal: Negative.    Genitourinary: Negative.    Neurological:  Positive for dizziness.   Psychiatric/Behavioral: Negative.     Allergic/Immunologic: Negative.      Physical Exam  General Appearance: moderately over weight   Neck Veins: neck veins are not distended   Thyroid: no nodules, masses, tenderness or enlargement   Chest Inspection: chest is normal in appearance   Respiratory Effort: breathing comfortably, no respiratory distress   Auscultation/Percussion: lungs clear to auscultation, no rales or rhonchi, no wheezing   PMI: PMI not enlarged or displaced   Cardiac Rhythm: regular rhythm and normal rate   Cardiac Auscultation: S1, S2 normal, no rub, no gallop   Murmurs: no murmur   Carotid Arteries: normal carotid upstroke bilaterally, no bruit   Brachial Arteries: normal symmetric brachial pulses   Radial Arteries: normal symmetric radial pulses   Abdominal Aorta: no abdominal aortic bruit   Pedal Pulses: normal symmetric pedal pulses   Lower Extremity Edema: no lower extremity edema   Abdominal Exam: soft, non-tender, no masses, bowel sounds normal   Liver & Spleen: no organomegaly   Orientation: oriented to time, place and person      Cardiovascular Studies  1. January 13, 2008: Cardiac catheterization secondary to an acute anterior wall   myocardial infarction. Reperfusion of LAD, high-grade lesion in the diagonal branch,   a 2.5 Xience stent placed there. Full report in the [facility].   2. January 16, 2008: Echo Doppler ejection fraction improved to 60 percent.   3. January 16, 2008: Renal duplex study. No evidence of any significant stenosis in   the renal arteries.   4. January 11, 2008: Exercise stress MPI. No evidence of fixed or reversible   perfusion defect, ejection fraction 51 percent.   5. October 2010, exercise stress test negative for any significant ischemia. Normal ejection fraction   6. November 04, 2012, regadenoson stress thallium showed no evidence of any significant ischemia. Ejection fraction at 50%.   7. December 17, 2014 cardiac catheterization showed previously placed stent in the LAD was patent. A proximal to mid LAD lesion of 80% had a 3 x 28 mm drug-eluting  stent placed and post dilated with a 3 x 20 mm balloon. There was also a 60% lesion of the left PDA and 90% lesion of the nondominant right.   8. December 16, 2014 echo Doppler revealed a left ventricular function of 50% to 55%. No significant valvular abnormalities were noted.   9. May 17, 2016 echo Doppler.  Left ventricular cavity size is normal.  Moderately decreased systolic function with ejection fraction of 40-45%.  No significant valvular abnormalities.  Trace pericardial effusion.  10. May 17, 2016 cardiac catheterization.  Moderate coronary artery disease involving the left circumflex artery as well as the posterior left ventricular and posterior descending arterial branch of the left circumflex artery.  High-grade lesion of a small nondominant right coronary artery. Patent stents in the proximal left anterior descending artery and the proximal first diagonal vessel.  Normal left ventricular end-diastolic pressures.  11. December 10, 2016 echo Doppler.  Left ventricle ejection fraction of 55%.  No significant Doppler abnormalities.  12.  EKG today shows sinus rhythm.  Heart rate 78 bpm.  Compared from previous electrocardiogram some nonspecific ST-T changes are noted in leads III and aVF.      Cardiovascular Health Factors  Vitals BP Readings from Last 3 Encounters:   06/30/22 (!) 138/92   12/28/21 138/72   06/16/21 132/86     Wt Readings from Last 3 Encounters:   06/30/22 87.1 kg (192 lb 1.6 oz)   12/28/21 86.3 kg (190 lb 4.8 oz)   06/16/21 87.3 kg (192 lb 6.4 oz) BMI Readings from Last 3 Encounters:   06/30/22 26.05 kg/m?   12/28/21 25.81 kg/m?   06/16/21 26.09 kg/m?      Smoking Social History     Tobacco Use   Smoking Status Every Day    Current packs/day: 1.00    Average packs/day: 1 pack/day for 30.0 years (30.0 ttl pk-yrs)    Types: Cigarettes   Smokeless Tobacco Never   Tobacco Comments    has a nurse through the insurance company that calls      Lipid Profile Cholesterol   Date Value Ref Range Status   09/01/2021 105  Final     HDL   Date Value Ref Range Status   09/01/2021 34 (A)  Final     LDL   Date Value Ref Range Status   09/01/2021 46  Final     Triglycerides   Date Value Ref Range Status   09/01/2021 128  Final      Blood Sugar Hemoglobin A1C   Date Value Ref Range Status   08/13/2020 8.0  Final     Glucose   Date Value Ref Range Status   05/20/2022 66 (L)  Final   09/01/2021 100  Final   08/13/2020 119  Final     Glucose, POC   Date Value Ref Range Status   05/18/2016 202 (H) 70 - 100 MG/DL Final   16/02/9603 540 (H) 70 - 100 MG/DL Final   98/03/9146 829 (H) 70 - 100 MG/DL Final          Problems Addressed Today  Encounter Diagnoses   Name Primary?    Hyperlipidemia, unspecified hyperlipidemia type Yes    Coronary artery disease involving native coronary artery of native heart without angina pectoris     Essential hypertension        Assessment and Plan     1.  Coronary artery disease with previous anterior wall myocardial function.  Patient had  a repeat cardiac catheterization secondary to a complication during his stress test in the office.  Cardiac catheterization showed patent stents.  Patient continues to do well with no symptoms suggestive of angina.  2.  Ischemic cardiomyopathy.  Patient on optimal medications.  Last echo Doppler revealed normal LV function.  Due to nonspecific EKG changes did order a follow-up echocardiogram.    3.  Hypertension.  Blood pressures are running high even at home.  Increase the amlodipine as noted above.  4. Dyslipidemia.  Patient started on rosuvastatin.  Lipids are looking good.  Patient states that he just had a lipid profile in January.  Will get those numbers.  5.  Type 2 diabetes mellitus.  Follows with his PCP.  6.  History of smoking.  Patient has cut back on smoking.  Patient counseled once again today.  No plans to quit smoking completely at this time.  7.  History of chronic kidney disease stage III.  Stable renal function         Current Medications (including today's revisions)   ALPRAZolam (XANAX) 0.5 mg tablet Take one tablet by mouth as Needed.    amLODIPine (NORVASC) 5 mg tablet Take one tablet by mouth daily.    aspirin EC 81 mg tablet Take one tablet by mouth daily. Take with food.    calcium carbonate/vitamin D3 (VITAMIN D-3 PO) Take 500 Units by mouth daily.    carvediloL (COREG) 12.5 mg tablet Take one tablet by mouth twice daily. take with food    citalopram (CELEXA) 20 mg tablet Take  by mouth Daily.    insulin glargine (TOUJEO SOLOSTAR) 300 unit/mL (1.5 mL) injectable Inject sixty four Units under the skin daily.    JARDIANCE 25 mg tablet Take one tablet by mouth daily.    nitroglycerin (NITROSTAT) 0.4 mg tablet Place one tablet under tongue every 5 minutes as needed for Chest Pain.    rosuvastatin (CRESTOR) 20 mg tablet Take one tablet by mouth daily.

## 2022-06-30 NOTE — Patient Instructions
Follow-Up:    -Thank you for allowing [place] to participate in your care today. Your After Visit Summary is being completed by Marland Kitchen, RN.    -We would like you to follow up in  6 months with Myriam Jacobson, MD  -The schedule is released approximately 4-5 months in advance. You will be called by our scheduling department to make an appointment and you will also receive a notification via MyChart to self-schedule.  However, if you would like to call to make this appointment, please call [phone].    -Please schedule the following testing at check out, or by calling our scheduling line: ECHOCARDIOGRAM    Changes From Today's Office Visit   Check lipid profile at your earliest convenience. You do need to be fasting when lab is drawn. (Nothing except water and prescription medications for 12 hrs before blood is drawn.)    Change positions slowly, stay well hydrated    Increase your amlodipine to 10 mg daily    Contacting our office:    -For NON-URGENT questions please contact us via message through your MyChart account.   -For all medication refills please contact your pharmacy or send a request through MyChart.     -For all questions that may need to be addressed urgently please call the [facility] nursing triage line at [phone] Monday - Friday 8am-5pm only. Please leave a detailed message with your name, date of birth, and reason for your call.  If your message is received before 3:30pm, every effort will be made to call you back the same day.  Please allow time for [place] to review your chart prior to call back.     -Should you have an urgent concern over the weekend/nights, the on-call triage line is [phone].    Sher[facility] Hail nursing team fax number: [phone]    -You may receive a survey in the upcoming weeks from [facility]. Your feedback is important to [place] and helps [place] continue to improve patient care and patient satisfaction.     -Please feel free to call our Financial Department at [phone] with any questions or concerns about estimated cost of testing or imaging ordered today. We are happy to provide CPT codes upon request.    Results & Testing Follow Up:    -Please allow 5-7 business days for the results of any testing to be reviewed. Please call our office if you have not heard from a nurse within this time frame.    -Should you choose to complete testing at an outside facility, please contact our office after completion of testing so that we can ensure that we have received results for your provider to review.    Lab and test results:  As a part of the CARES act, starting 08/09/2019, some results will be released to you via MyChart immediately and automatically.  You may see results before your provider sees them; however, your provider will review all these results and then they, or one of their team, will notify you of result information and recommendations.   Critical results will be addressed immediately, but otherwise, please allow [place] time to get back with you prior to you reaching out to [place] for questions.  This will usually take about 72 hours for labs and 5-7 days for procedure test results.

## 2022-07-01 DIAGNOSIS — I2510 Atherosclerotic heart disease of native coronary artery without angina pectoris: Secondary | ICD-10-CM

## 2022-07-01 DIAGNOSIS — Z794 Long term (current) use of insulin: Secondary | ICD-10-CM

## 2022-07-01 DIAGNOSIS — E1165 Type 2 diabetes mellitus with hyperglycemia: Secondary | ICD-10-CM

## 2022-07-28 ENCOUNTER — Ambulatory Visit: Admit: 2022-07-28 | Discharge: 2022-07-28 | Payer: MEDICARE | Primary: Family

## 2022-07-28 ENCOUNTER — Encounter: Admit: 2022-07-28 | Discharge: 2022-07-28 | Payer: MEDICARE | Primary: Family

## 2022-07-28 DIAGNOSIS — I2510 Atherosclerotic heart disease of native coronary artery without angina pectoris: Secondary | ICD-10-CM

## 2022-07-28 DIAGNOSIS — E1165 Type 2 diabetes mellitus with hyperglycemia: Secondary | ICD-10-CM

## 2022-07-28 DIAGNOSIS — I10 Essential (primary) hypertension: Secondary | ICD-10-CM

## 2022-07-28 DIAGNOSIS — E785 Hyperlipidemia, unspecified: Secondary | ICD-10-CM

## 2022-07-28 MED ORDER — PERFLUTREN LIPID MICROSPHERES 1.1 MG/ML IV SUSP
1-10 mL | Freq: Once | INTRAVENOUS | 0 refills | Status: AC | PRN
Start: 2022-07-28 — End: ?

## 2022-07-28 MED ORDER — SODIUM CHLORIDE 0.9 % IJ SOLN
10 mL | Freq: Once | INTRAVENOUS | 0 refills | Status: AC
Start: 2022-07-28 — End: ?

## 2022-07-30 ENCOUNTER — Encounter: Admit: 2022-07-30 | Discharge: 2022-07-30 | Payer: MEDICARE | Primary: Family

## 2022-09-22 IMAGING — CT LOW_DOSE_LUNG(Adult)
2 of 9 series · 11 of 36 positions shown, 14 images · non-contrast
Comparison: none

[Series 10: lung cor 1.00 br44 s3 · coronal · 0.66mm/px · 1 of 293 slices shown]
[im 147/293  lung]
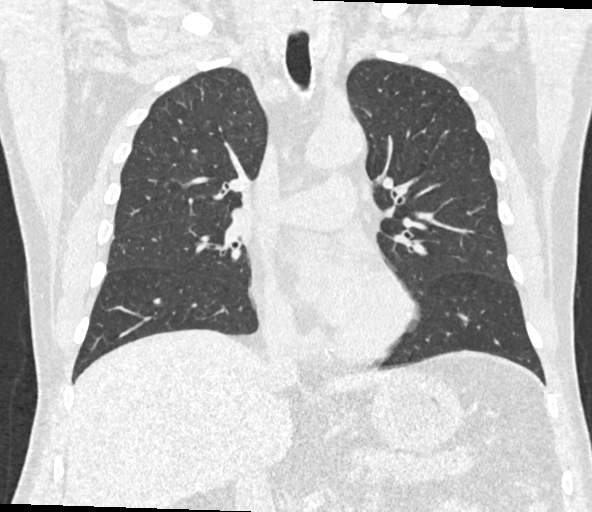

[Series 17: lung 1.00 br60 s3 cad · axial · 0.76mm/px · z∈[+1638,+1914]mm · 10 of 482 slices shown, 13 images]
[im 44/482  mediastinal]
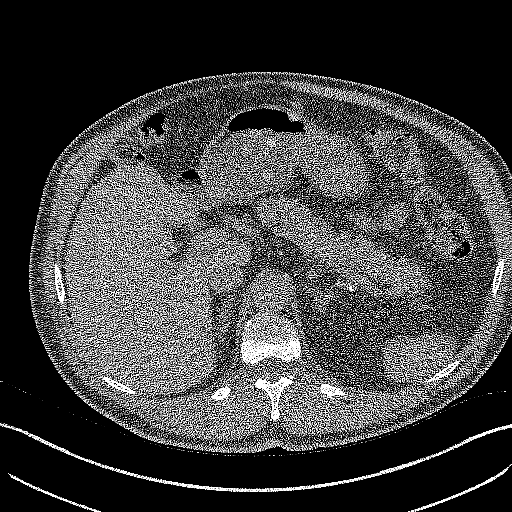
[im 44/482  lung]
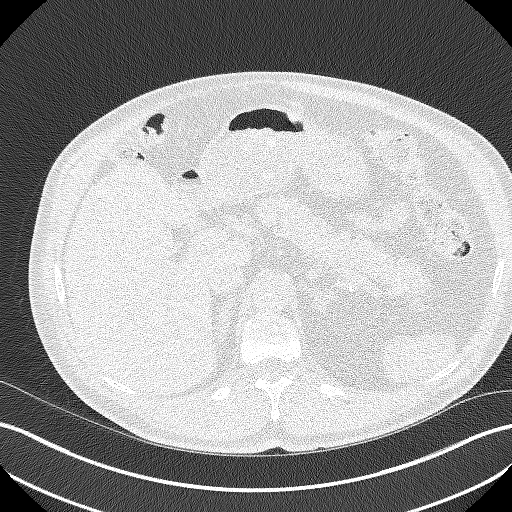
[im 88/482  lung]
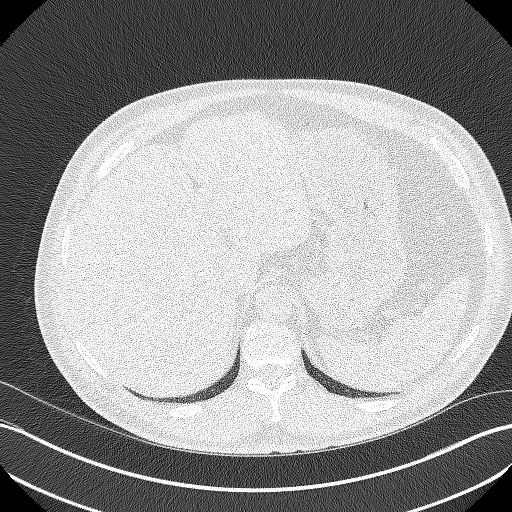
[im 132/482  lung]
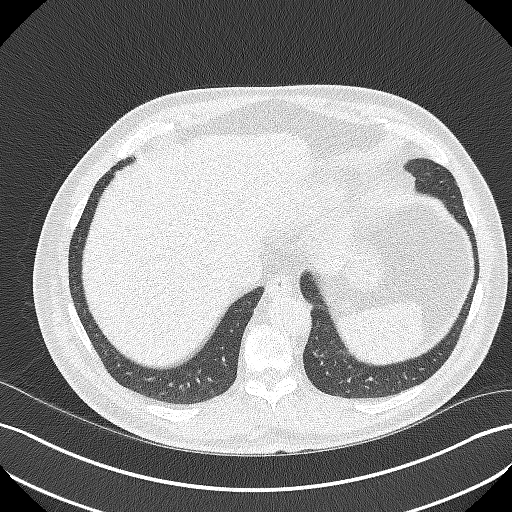
[im 175/482  lung]
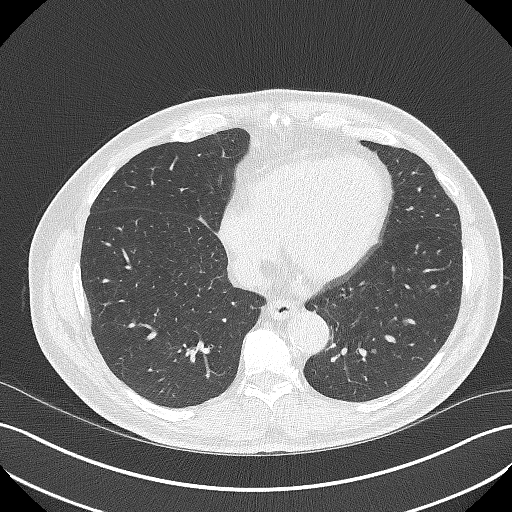
[im 219/482  mediastinal]
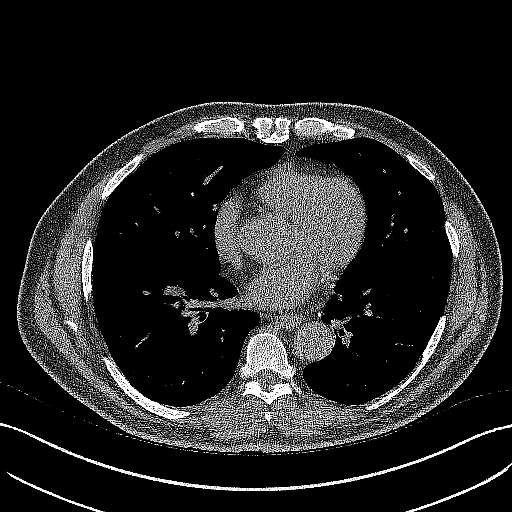
[im 219/482  lung]
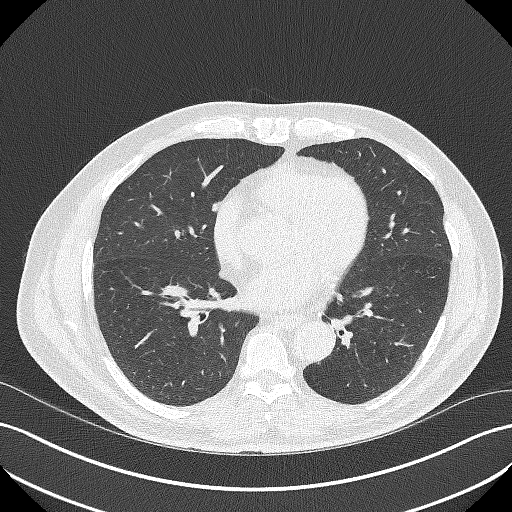
[im 263/482  lung]
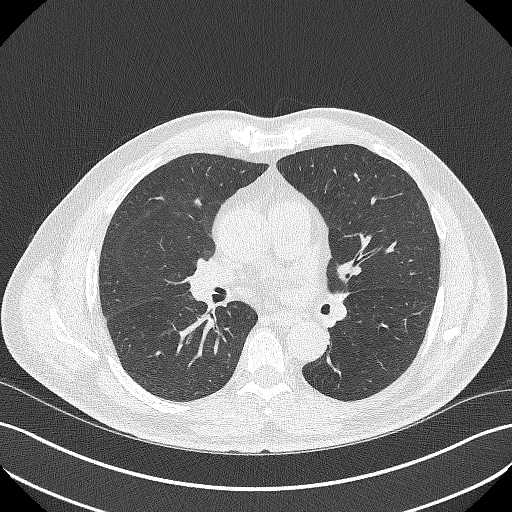
[im 307/482  lung]
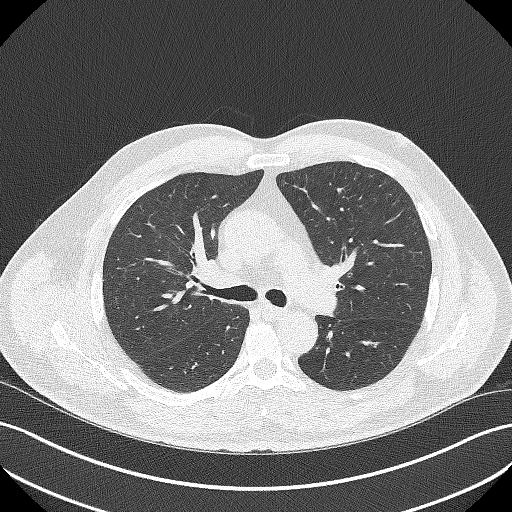
[im 350/482  lung]
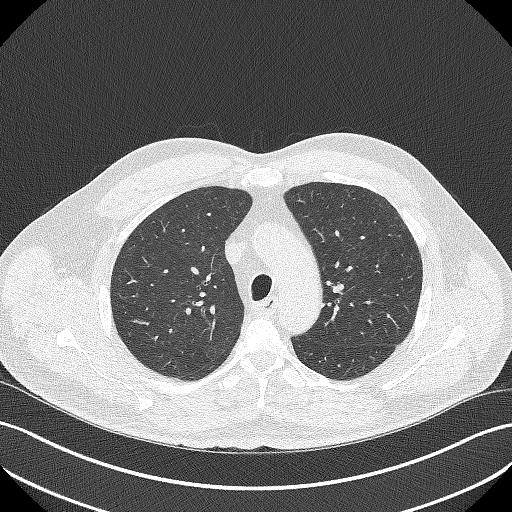
[im 394/482  mediastinal]
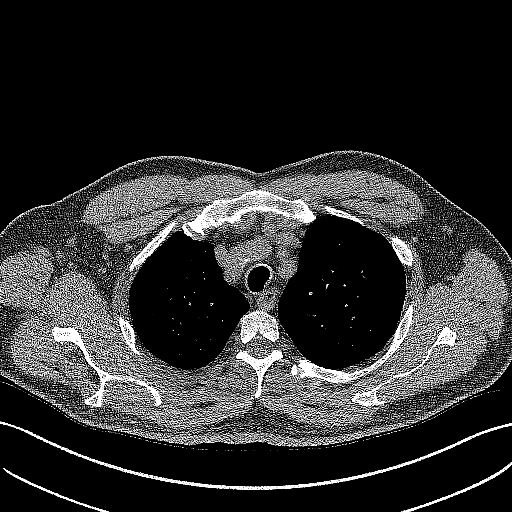
[im 394/482  lung]
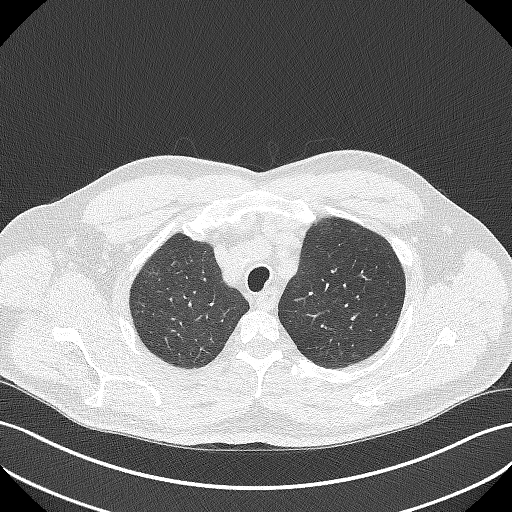
[im 438/482  lung]
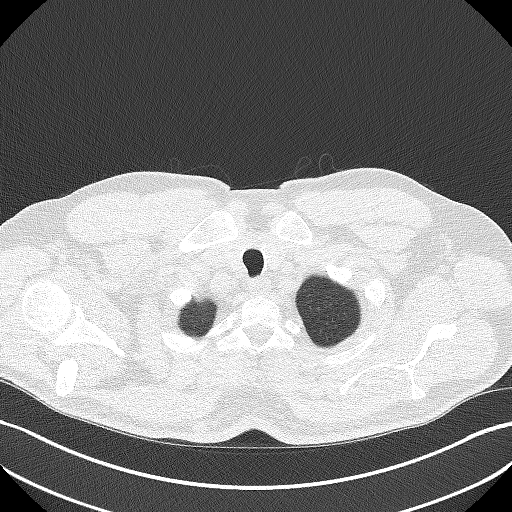

[11 of 36 positions shown; findings below may reference images not displayed]

EXAM

CT CHEST

INDICATION

Annual follow-up

TECHNIQUE

CT of the chest was performed. No intravenous contrast was administered.

All CT scans at this facility use dose modulation, iterative reconstruction, and/or weight based
dosing when appropriate to reduce radiation dose to as low as reasonably achievable.

# of CT scans in the past year: 0 # of Myocardial perfusion scans this past year: 0

COMPARISONS

09/09/2020

FINDINGS

Lower neck: [Unremarkable]

Lymphadenopathy: [Multiple non-pathologically enlarged mediastinal lymph nodes.]

Cardiovascular: [Normal cardiac size.] [No pericardial effusion.] [Coronary artery calcifications.]
[Normal caliber of the great vessels.]

Upper abdomen: [Unremarkable]

Musculoskeletal: [No acute fracture or aggressive focal osseous lesion.]

Lung parenchyma/pleural space: No suspicious focal airspace opacity, nodule, or consolidation. [No
pleural effusion. Calcified granuloma in the left lung base. Computed aided diagnosis captures
bilateral anterior areas of indentation on along the pleura secondary to marginal osteophytosis at
the 1st rib chondral sternal junctions.]

Central Airways: Patent

IMPRESSION
1. No suspicious lung nodule.
2. Lung-RADS category: 1.
3. Recommendation: Continue annual screening with low-dose chest CT in 12 months.

Tech Notes:

ANNUAL FOLLOW-UP. NO CURRENT CHEST COMPLAINTS. 37 YEAR PACK HISTORY.
HB

## 2022-12-30 ENCOUNTER — Encounter: Admit: 2022-12-30 | Discharge: 2022-12-30 | Payer: MEDICARE | Primary: Family

## 2023-01-05 ENCOUNTER — Encounter: Admit: 2023-01-05 | Discharge: 2023-01-05 | Payer: MEDICARE | Primary: Family

## 2023-01-05 NOTE — Progress Notes
Leiby L. Salas, 1955-10-30 has an appointment with Dr. [name] on 01/12/23.    Please send recent lab results for continuity of care.    Thank you,   Kalan Yeley    Phone: [phone]  Fax: [phone]

## 2023-01-06 ENCOUNTER — Encounter: Admit: 2023-01-06 | Discharge: 2023-01-06 | Payer: MEDICARE | Primary: Family

## 2023-01-12 ENCOUNTER — Encounter: Admit: 2023-01-12 | Discharge: 2023-01-12 | Payer: MEDICARE

## 2023-01-12 ENCOUNTER — Ambulatory Visit: Admit: 2023-01-12 | Discharge: 2023-01-13 | Payer: MEDICARE

## 2023-01-12 DIAGNOSIS — I2510 Atherosclerotic heart disease of native coronary artery without angina pectoris: Secondary | ICD-10-CM

## 2023-01-12 DIAGNOSIS — E785 Hyperlipidemia, unspecified: Secondary | ICD-10-CM

## 2023-01-12 DIAGNOSIS — N1832 Stage 3b chronic kidney disease (HCC): Secondary | ICD-10-CM

## 2023-01-12 DIAGNOSIS — E119 Type 2 diabetes mellitus without complications: Secondary | ICD-10-CM

## 2023-01-12 DIAGNOSIS — I10 Essential (primary) hypertension: Secondary | ICD-10-CM

## 2023-01-12 DIAGNOSIS — Z720 Tobacco use: Secondary | ICD-10-CM

## 2023-01-12 DIAGNOSIS — T50905A Adverse effect of unspecified drugs, medicaments and biological substances, initial encounter: Secondary | ICD-10-CM

## 2023-01-12 DIAGNOSIS — E1165 Type 2 diabetes mellitus with hyperglycemia: Secondary | ICD-10-CM

## 2023-01-12 NOTE — Progress Notes
Date of Service: 01/12/2023    Adam Galloway is a 67 y.o. male.       HPI     It was a pleasure to see Adam Galloway in the office today for his cardiovascular followup.     Adam Galloway is a very pleasant 67 year old gentleman with a history of coronary artery disease with a remote coronary intervention of the left anterior descending artery secondary to an anterior wall myocardial infarction.  This was done in 2009.  A repeat cardiac catheterization in 2016 showed that the previously placed stent in the LAD was patent.  However he had a high-grade lesion of the mid LAD.  He had another stent placed in 2016.  This was a 3 x 28 drug-eluting stent.  There was a moderate disease of the left PDA and a 90% lesion of the nondominant right coronary artery.  Repeat cardiac catheterization in 2018 revealed patent stents in the left and descending artery as well as in the diagonal branch and the rest of the coronary anatomy appears unchanged.     Today in the clinic he denies any chest discomfort.  Shortness of breath is about stable.  He is retired but still working 2 days a week part-time.  Overall there has been no significant change since his last follow-up with me.    Blood pressure is much better than last time.  Repeat blood pressure 130/82 mmHg.    Reviewed his lab workup.  Renal functions have deteriorated.  His GFR is now down to 31.  He says he is following his renal functions with his physician Dr. [name] and has another follow-up labs with him next week.    Last echocardiogram completed in March of this year showed a normal left ventricular function.  There was no evidence of any significant valvular abnormalities.  No significant changes since 2018.    Recommendations:  1.  We will continue his present medications.  Increasing the amlodipine has helped his blood pressures.  2.  Lipids were under good control.  Will continue with the rosuvastatin.  3.  Patient to follow-up with his PCP on his worsening renal functions.  I had suggested that he probably should also see a nephrologist.  4.  Cardiovascular status appears to be stable.  5.  As always smoking cessation was discussed with Adam Galloway today in the clinic.  Fortunately his recent lung scan was stable according to him.  No plans to quit smoking at this point.    Total time spent with Adam Galloway including reviewing his lab workup, recent echocardiogram as well as going over my recommendations and counseling was 30 minutes in the clinic.         Vitals:    01/12/23 0804 01/12/23 0807   BP: 138/82 128/82   BP Source: Arm, Left Upper Arm, Right Upper   Pulse: 74    SpO2: 98%    PainSc: Zero    Weight: 85 kg (187 lb 4.8 oz)    Height: 182.9 cm (6')      Body mass index is 25.4 kg/m?Marland Kitchen     Past Medical History  Patient Active Problem List    Diagnosis Date Noted    Acute shoulder pain due to trauma, right 02/01/2018    Erythrocytosis 11/09/2017    OSA (obstructive sleep apnea) 10/06/2017    Asystole (HCC) 05/17/2016    Essential hypertension 04/16/2015    Dyslipidemia     DM (diabetes mellitus), type 2 (HCC)  Tobacco abuse     HLD (hyperlipidemia) 01/16/2008    CAD (coronary artery disease) 01/16/2008     2009-Previous anterior wall myocardial infarction with complete recovery and status post PCI of the diagonal branch.  12/16/14- [facility]- STEMI- PCI- PLAD    8/8/16[facility]-   Echocardiogram- EF 50-55%, no focal left ventricular wall motion abnormalities      STEMI (ST elevation myocardial infarction) (HCC) 01/16/2008    Anxiety 01/16/2008    Hyperglycemia 01/16/2008         Review of Systems   Constitutional: Negative.   HENT: Negative.     Eyes: Negative.    Cardiovascular: Negative.    Respiratory: Negative.     Endocrine: Negative.    Hematologic/Lymphatic: Negative.    Skin: Negative.    Gastrointestinal: Negative.    Genitourinary: Negative.    Neurological: Negative.    Psychiatric/Behavioral: Negative.     Allergic/Immunologic: Negative.      Physical Exam  General Appearance: moderately over weight   Neck Veins: neck veins are not distended   Thyroid: no nodules, masses, tenderness or enlargement   Chest Inspection: chest is normal in appearance   Respiratory Effort: breathing comfortably, no respiratory distress   Auscultation/Percussion: lungs clear to auscultation, no rales or rhonchi, no wheezing   PMI: PMI not enlarged or displaced   Cardiac Rhythm: regular rhythm and normal rate   Cardiac Auscultation: S1, S2 normal, no rub, no gallop   Murmurs: no murmur   Carotid Arteries: normal carotid upstroke bilaterally, no bruit   Brachial Arteries: normal symmetric brachial pulses   Radial Arteries: normal symmetric radial pulses   Abdominal Aorta: no abdominal aortic bruit   Pedal Pulses: normal symmetric pedal pulses   Lower Extremity Edema: no lower extremity edema   Abdominal Exam: soft, non-tender, no masses, bowel sounds normal   Liver & Spleen: no organomegaly   Orientation: oriented to time, place and person      Cardiovascular Studies  1. January 13, 2008: Cardiac catheterization secondary to an acute anterior wall   myocardial infarction. Reperfusion of LAD, high-grade lesion in the diagonal branch,   a 2.5 Xience stent placed there. Full report in the [facility].   2. January 16, 2008: Echo Doppler ejection fraction improved to 60 percent.   3. January 16, 2008: Renal duplex study. No evidence of any significant stenosis in   the renal arteries.   4. January 11, 2008: Exercise stress MPI. No evidence of fixed or reversible   perfusion defect, ejection fraction 51 percent.   5. October 2010, exercise stress test negative for any significant ischemia. Normal ejection fraction   6. November 04, 2012, regadenoson stress thallium showed no evidence of any significant ischemia. Ejection fraction at 50%.   7. December 17, 2014 cardiac catheterization showed previously placed stent in the LAD was patent. A proximal to mid LAD lesion of 80% had a 3 x 28 mm drug-eluting stent placed and post dilated with a 3 x 20 mm balloon. There was also a 60% lesion of the left PDA and 90% lesion of the nondominant right.   8. December 16, 2014 echo Doppler revealed a left ventricular function of 50% to 55%. No significant valvular abnormalities were noted.   9. May 17, 2016 echo Doppler.  Left ventricular cavity size is normal.  Moderately decreased systolic function with ejection fraction of 40-45%.  No significant valvular abnormalities.  Trace pericardial effusion.  10. May 17, 2016 cardiac catheterization.  Moderate coronary  artery disease involving the left circumflex artery as well as the posterior left ventricular and posterior descending arterial branch of the left circumflex artery.  High-grade lesion of a small nondominant right coronary artery. Patent stents in the proximal left anterior descending artery and the proximal first diagonal vessel.  Normal left ventricular end-diastolic pressures.  11. December 10, 2016 echo Doppler.  Left ventricle ejection fraction of 55%.  No significant Doppler abnormalities.  12.  August 05, 2022 echocardiogram: Normal left ventricular size and function.  Ejection fraction 60%.  No significant valvular abnormalities.  No significant changes since 2018.      Cardiovascular Health Factors  Vitals BP Readings from Last 3 Encounters:   01/12/23 128/82   07/28/22 (!) 162/94   06/30/22 (!) 138/92     Wt Readings from Last 3 Encounters:   01/12/23 85 kg (187 lb 4.8 oz)   07/28/22 87.5 kg (193 lb)   06/30/22 87.1 kg (192 lb 1.6 oz)     BMI Readings from Last 3 Encounters:   01/12/23 25.40 kg/m?   07/28/22 26.18 kg/m?   06/30/22 26.05 kg/m?      Smoking Social History     Tobacco Use   Smoking Status Every Day    Current packs/day: 1.00    Average packs/day: 1 pack/day for 30.0 years (30.0 ttl pk-yrs)    Types: Cigarettes   Smokeless Tobacco Never   Tobacco Comments    has a nurse through the insurance company that calls      Lipid Profile Cholesterol   Date Value Ref Range Status   05/20/2022 118  Final     HDL   Date Value Ref Range Status   05/20/2022 42  Final     LDL   Date Value Ref Range Status   05/20/2022 54  Final     Triglycerides   Date Value Ref Range Status   05/20/2022 112  Final      Blood Sugar Hemoglobin A1C   Date Value Ref Range Status   08/13/2020 8.0  Final     Glucose   Date Value Ref Range Status   11/03/2022 121 (H)  Final   05/20/2022 66 (L)  Final   09/01/2021 100  Final     Glucose, POC   Date Value Ref Range Status   05/18/2016 202 (H) 70 - 100 MG/DL Final   16/02/9603 540 (H) 70 - 100 MG/DL Final   98/03/9146 829 (H) 70 - 100 MG/DL Final          Problems Addressed Today  No diagnosis found.    Assessment and Plan     1.  Coronary artery disease with previous anterior wall myocardial function.  Patient had a repeat cardiac catheterization secondary to a complication during his stress test in the office.  Cardiac catheterization showed patent stents.  Patient continues to do well with no symptoms suggestive of angina.  2.  Ischemic cardiomyopathy.  Patient on optimal medications.  Recent echo Doppler revealed normal LV function.  No signs of any volume overload.  No signs of any heart failure.  3.  Hypertension.  Blood pressures are doing well since increasing his amlodipine.  Blood pressure repeated by me was 130/82 mmHg.  4.  Dyslipidemia.  Patient started on rosuvastatin.  Lipids are looking good.   5.  Type 2 diabetes mellitus.  Follows with his PCP.  6.  History of smoking.  Patient has cut back on smoking.  Patient counseled  once again today.  No plans to quit smoking completely at this time.  7.  History of chronic kidney disease stage III.  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  Plan as have discussed with him and noted above.         Current Medications (including today's revisions)   ALPRAZolam (XANAX) 0.5 mg tablet Take one tablet by mouth as Needed.    amLODIPine (NORVASC) 10 mg tablet Take one tablet by mouth daily.    aspirin EC 81 mg tablet Take one tablet by mouth daily. Take with food.    calcium carbonate/vitamin D3 (VITAMIN D-3 PO) Take 500 Units by mouth daily.    carvediloL (COREG) 12.5 mg tablet Take one tablet by mouth twice daily. take with food    citalopram (CELEXA) 20 mg tablet Take  by mouth Daily.    insulin glargine (TOUJEO SOLOSTAR) 300 unit/mL (1.5 mL) injectable Inject sixty four Units under the skin daily.    JARDIANCE 25 mg tablet Take one tablet by mouth daily.    nitroglycerin (NITROSTAT) 0.4 mg tablet Place one tablet under tongue every 5 minutes as needed for Chest Pain.    rosuvastatin (CRESTOR) 20 mg tablet Take one tablet by mouth daily.

## 2023-01-12 NOTE — Patient Instructions
Thank you for visiting our office today.   We recommend follow-up with our office in 6 months.   Please call [phone] if you need to re-schedule the office visit Dr. [name] ordered.     Please complete the following orders:     -Follow up with your primary care provider to monitor your kidney function     Take your medications as ordered.   Check your list with what you have on hand at home.   Should you have any additional questions or concerns, please message me through MyChart or call the office.    Cardiovascular Medicine Team   Clinic phone: [phone]

## 2023-02-28 ENCOUNTER — Encounter: Admit: 2023-02-28 | Discharge: 2023-02-28 | Payer: MEDICARE

## 2023-02-28 MED ORDER — ROSUVASTATIN 20 MG PO TAB
20 mg | ORAL_TABLET | Freq: Every day | ORAL | 0 refills | 90.00000 days | Status: AC
Start: 2023-02-28 — End: ?

## 2023-02-28 NOTE — Telephone Encounter
Pt is due for lipids mailed to pt.

## 2023-03-02 ENCOUNTER — Encounter: Admit: 2023-03-02 | Discharge: 2023-03-02 | Payer: MEDICARE

## 2023-03-02 DIAGNOSIS — E785 Hyperlipidemia, unspecified: Secondary | ICD-10-CM

## 2023-03-07 ENCOUNTER — Encounter: Admit: 2023-03-07 | Discharge: 2023-03-07 | Payer: MEDICARE

## 2023-03-07 MED ORDER — CARVEDILOL 12.5 MG PO TAB
12.5 mg | ORAL_TABLET | Freq: Two times a day (BID) | ORAL | 3 refills | 90.00000 days | Status: AC
Start: 2023-03-07 — End: ?

## 2023-03-10 ENCOUNTER — Encounter: Admit: 2023-03-10 | Discharge: 2023-03-10 | Payer: MEDICARE

## 2023-05-02 ENCOUNTER — Encounter: Admit: 2023-05-02 | Discharge: 2023-05-02 | Payer: MEDICARE

## 2023-05-02 MED ORDER — AMLODIPINE 10 MG PO TAB
10 mg | ORAL_TABLET | Freq: Every day | ORAL | 1 refills | Status: AC
Start: 2023-05-02 — End: ?

## 2023-05-27 ENCOUNTER — Encounter: Admit: 2023-05-27 | Discharge: 2023-05-27 | Payer: MEDICARE

## 2023-06-01 ENCOUNTER — Encounter: Admit: 2023-06-01 | Discharge: 2023-06-01 | Payer: MEDICARE

## 2023-06-02 ENCOUNTER — Encounter: Admit: 2023-06-02 | Discharge: 2023-06-02 | Payer: MEDICARE

## 2023-06-02 NOTE — Telephone Encounter
New Referral Intake    Re-referred? Have they met all criteria to re-refer? (See denial letter)          TO PATIENT:  Thank you for contacting us, how did  you hear about our transplant program? Through my Dr. In Adam Galloway. Adam Galloway    Is English your Native/First language? yes   If no, do you or your support person need an interpreter?      (HARD STOPS)  *Do you have a support person who will be with you before, during, and after a transplant?yes        Name, Relationship and contact number (or e-mail) of Support Person: wife, Adam Galloway-can drive  (over 69 yoa, can/will drive to [place](city) & lives locally to you?)    *Would you accept a lifesaving blood transfusion? yes (IF NO-stop!)  *Open wounds or sores? no  (IF YES- have they been diagnosed with Diabetes?, IF YES-stop!)  *Any active infections? no  , On Oral or IV antibiotics? no  *Do you live in a Nursing Facility? no (If YES, at what level? Independent Living, Assisted Living or Skilled/Rehab?)      *Primary Insurance Company: [facility] Medicare     Policy #:      Group ID:     Secondary Insurance Company: Medicare A/B     Policy #:      Group ID:     Who is your Advertising account planner) kidney doctor?  Dr. [name]  Who is your (PCP) primary care provider (MD/NP/PA-C)?  Dr. [name]    Have you had a hospitalization in the past 6 months?  no If YES, ask what hospital, what date and why?     Past Medical History:  (if answer is yes, please specify)    How tall are you (in inches)?  6'1  How much do you weigh (in pounds)? 190lb  Calculated BMI:  25.1    What is the cause of your kidney disease?  idk (Ex: DM, HTN, IgA, PKD, etc)  Have you ever had a kidney biopsy? yes  If yes, where/when? Just did that in [place]. Adam Galloway-[facility]    Have you ever been diagnosed with Diabetes?  no   (If No, skip to dialysis)    What Type?   At what age were you diagnosed?  Do you see an Endocrinologist?   (IF Type 1, BMI is 30 or below, diagnosed under 58 yoa and current age is under 60--> ask if interested in SPK)     Are you currently on dialysis?  no (If No, skip the next 4 questions)   Type/ Start date (chronic)/ Schedule/ Time:    IF started more than 6 months ago ask, Why the delay being referred to Transplant?     What center:                  Do you receive [employer] ([employer]) premium assistance?   If yes, how much per month?                 Have you been denied or listed by another transplant center? no IF denied, why?   Are you currently working with any other transplant centers to become listed? no  Have you had a previous organ transplant? no  If YES, when and where?     Are you up-to-date on your age-appropriate Vaccinations? yes (Influenza, Pneumonia, Tetanus, Shingles, Hepatitis B & Covid-19)  HIGHLY RECOMMENDED  Are you up-to-date on your age  appropriate Cancer Screenings? yes [Women age 18- Pap smear- good for 5 years if NML (unless Hysterectomy); Women age 36- annual Mammogram;   Men age 89- annual PSA; All age 43- Colonoscopy (Cologuard is not acceptable)]    Nutrition: Recent weight loss without trying? no, If yes, how many pounds in what timeframe?                   Eating poorly due to decreased appetite? no    History of:  Heart disease?  Yes-high bp; had 3 heart attacks If yes, do you have a heart doctor? Dr. [name]     History of heart stents?  3  Other stents (legs)?: no     Have you had any cardiac testing within the last year? yes (ECG, Stress, Echo, Cath)       Are you on medication to keep your blood thin (ex. Coumadin, Plavix, Brilinta- do not include Heparin for dialysis)? no     Are you on medication to help raise your blood pressure (Midodrine/Florinef)? no     Lung disease? no, If yes, do you see a Pulmonologist?      Are you on oxygen? no  If Yes, how many liters?  with dialysis or all the time?      Do you wear CPAP (breathing machine at night to help you sleep)? yes                    Do you use tobacco or nicotine products or have you in the past? Currently - smoking  (this includes vaping and patches)   Packs/cans per day? 15-20 cigs a day for years? 50  When did you quit? Trying to quit   (TO PATIENT: If you currently use tobacco or nicotine products, you need to quit to be considered a transplant candidate.)                 Do you use any marijuana products ? no  (IF smoking will need to change to edibles)    Do you use illegal substances or have you in the past? no    Do you take narcotics or controlled substances for pain control? no                 Liver disease?  no                            If YES, what kind, when diagnosed?   History of HIV or Hepatitis? no    If yes, who treated and where?   Cancer? no     If yes, what kind, when diagnosed, where treated?     Psychiatric illness (Depression/Anxiety/Bipolar Disorder)?  no    Do you see a psychiatrist or counselor for any reason?  no                 Do you use a cane/walker/wheelchair to get around? no (IF yes, are you receiving therapy services ?)    Past Surgical History:  Have you had any amputations?  no   Have you had any abdominal surgeries? no  Have you had a CAT scan of your abdomen/pelvis recently? no  IF so, where/when?   Have you had any surgeries on blood vessels (not including a fistula)? no     Demographic and Social History:  (Please do not turn these into a leading questions, ask them as  they are written)  What gender were you assigned at birth (male or male)?  male  (Needed for Surgical Verification)  Do you still identify as a_____? yes  What race do you identify with (1- Hispanic/Non-Hispanic, 2- Native American/Native [place], [address] or [place], Native [place] or other Pacific Islander, Asian or [place]- includes Hispanic)? white  [For Tissue-typing purposes]    Has anyone shown interest in being your living donor?  (Not applicable with SHK/SLK) no   If so, they are welcome to come to evaluation with you (up to two persons)  If no, refer to BIG ASK/BIG GIVE as a resource at [email or web address]    If patient has possible living donor - have them call:  Lelon Mast - [phone] #[phone]   Alpha: Cherylynn Ridges - [phone] #2 Alpha: N-Z    If not already in MyChart.Marland KitchenMarland KitchenWe may need you to sign a Release of Information Authorization Form. Do you have access to a fax machine or an e-mail address we can send it to? [email or web address]     *IF ANY OF THIS INFORMATION CHANGES BEFORE WE POTENTIALLY BRING YOU IN FOR EVALUATION, PLEASE LET us KNOW ASAP! (SUPPORT, WOUNDS, CHANGE IN FUNCTIONAL STATUS, PENDING SURGERIES, INSURANCE CHANGES, RECENT HOSPITALIZATIONS, ETC)     **After reviewing all available records, our Referral Nurse will send the Providers (cc: the pt) a list of items that will need to be addressed. They DO NOT need to be completed before being evaluated but will be required before being considered for Listing.    Note to staff -   Obtain the following records:  nephrology note  hospital discharge summary within 6 month (or most recent)   pathology report    Notify Dr. Curt Bears if delay in scheduling due to lack of records

## 2023-06-02 NOTE — Progress Notes
Reviewed referral records and in O2/CE. Prelim Needs Letter e-faxed to Providers and to Patient via MyChart. Moved to financial.

## 2023-06-08 ENCOUNTER — Encounter: Admit: 2023-06-08 | Discharge: 2023-06-08 | Payer: MEDICARE

## 2023-06-08 NOTE — Telephone Encounter
TRANSPLANT BENEFIT COLLECTION:  AUTHORIZATION REQUIRED FOR EVALUATION. PLEASE ADVISE TFA OF DATE FOR CM APPROVAL. MAY TAKE 15 BUSINESS DAYS    TRANSPLANT TYPE: Kidney    Verified by: Kennon Rounds   Date: 06/08/2023  Spoke to: Carney Bern   Call Reference#: [id number]  ID #: [id number]   GR#: [id number]   Subscriber: Self  Ins Plan: [employer] AARP Medicare   EFF: 05/11/2023   Phone#: [phone]   Plan Type: HMO  Deductible: No deductible  Co-ins: No coinsurance  Out of Pocket: $3800  Inpt Copay: $275 Days 1-7  Outpt Copay: $275  OV PCP/Spec Copay: $0 / $35  Additional Benefit Limits: No Lifetime Max  Plan limitations/concerns: [facility] is In-Network  Passport [employer] Ref#: [id number]    PART D   RX Plan: [facility]   Phone #: [phone]  Spoke to: [place]   Call Reference#: [phone]  Current Subsidy: No Extra Help  Generic: $47  Coverage gap in 2025:  $2000 Out of Pocket    TXP Network: [facility]  NCM: Needs to be assigned  Phone #: [phone]   FAX #: N/A  Auth requirements: AUTHORIZATION REQUIRED FOR EVALUATION AND LISTING

## 2023-06-10 ENCOUNTER — Encounter: Admit: 2023-06-10 | Discharge: 2023-06-10 | Payer: MEDICARE

## 2023-06-14 ENCOUNTER — Encounter: Admit: 2023-06-14 | Discharge: 2023-06-14 | Payer: MEDICARE

## 2023-06-14 DIAGNOSIS — Z01818 Encounter for other preprocedural examination: Secondary | ICD-10-CM

## 2023-06-16 ENCOUNTER — Encounter: Admit: 2023-06-16 | Discharge: 2023-06-16 | Payer: MEDICARE

## 2023-06-17 ENCOUNTER — Encounter: Admit: 2023-06-17 | Discharge: 2023-06-17 | Payer: MEDICARE

## 2023-06-17 NOTE — Telephone Encounter
 INSURANCE COVERAGE / FINANCIAL INFORMATION     Called [facility] Transplant Department [phone] for Kidney Transplant Evaluation Authorization and spoke with Benjie Karvonen. She took all my information and patient information and said that a NCM will contact me. Pending Auth# [id number]

## 2023-06-21 ENCOUNTER — Encounter: Admit: 2023-06-21 | Discharge: 2023-06-21 | Payer: MEDICARE

## 2023-06-21 DIAGNOSIS — I2510 Atherosclerotic heart disease of native coronary artery without angina pectoris: Secondary | ICD-10-CM

## 2023-06-21 DIAGNOSIS — E1165 Type 2 diabetes mellitus with hyperglycemia: Secondary | ICD-10-CM

## 2023-06-21 DIAGNOSIS — E785 Hyperlipidemia, unspecified: Secondary | ICD-10-CM

## 2023-06-21 MED ORDER — ROSUVASTATIN 20 MG PO TAB
20 mg | ORAL_TABLET | Freq: Every day | ORAL | 0 refills | 90.00000 days | Status: AC
Start: 2023-06-21 — End: ?

## 2023-06-21 NOTE — Telephone Encounter
 06/21/2023 5:05 PM    Refilled Rosuvastatin for 30 days. Ordered FLP for pt. Pt. Scheduled to see Dr. [name] in March 2025.

## 2023-06-22 ENCOUNTER — Encounter: Admit: 2023-06-22 | Discharge: 2023-06-22 | Payer: MEDICARE

## 2023-06-22 NOTE — Telephone Encounter
 TRANSPLANT AUTHORIZATION    PLAN [facility] AARP Medicare AUTHORIZATION Phase 1 Evaluation  Auth #: [id number]   EFF: 06/17/2023   EXP: No Exp. Date  NCM: N/A   Phone #: [phone]   FAX #: N/A      TRANSPLANT AUTHORIZATION    PLAN [facility] AARP Medicare AUTHORIZATION Phase 2 Listing  Auth #: [id number]   EFF: 06/17/2023   EXP: No Exp. Date  NCM: N/A   Phone #: [phone]   FAX #: N/A

## 2023-06-27 ENCOUNTER — Encounter: Admit: 2023-06-27 | Discharge: 2023-06-27 | Payer: MEDICARE

## 2023-06-28 ENCOUNTER — Encounter: Admit: 2023-06-28 | Discharge: 2023-06-28 | Payer: MEDICARE

## 2023-06-28 NOTE — Telephone Encounter
 Spoke to wife, Aram Beecham, she stated pt passed away last night 07/27/2023). She went into detail stating last Friday (2/14) he was not feeling good, went to [facility]- said his bp was really low, d/c'd home, over the weekend would not eat, Monday 07/27/2023) pt could only tolerate 30 min or dialysis and bp went lower. Dialysis had him transported to [facility] er again, was Dc'd and passed away at home.

## 2023-06-30 ENCOUNTER — Encounter: Admit: 2023-06-30 | Discharge: 2023-06-30 | Payer: MEDICARE

## 2023-06-30 NOTE — Progress Notes
 Adam Galloway, 1956-01-02 has an appointment with Dr. [name] on 07/13/23.    Please send recent lab results for continuity of care.    Thank you,   Rheya Minogue    Phone: [phone]  Fax: [phone]

## 2023-07-01 ENCOUNTER — Encounter: Admit: 2023-07-01 | Discharge: 2023-07-01 | Payer: MEDICARE

## 2023-07-04 ENCOUNTER — Encounter: Admit: 2023-07-04 | Discharge: 2023-07-04 | Payer: MEDICARE

## 2023-07-09 DEATH — deceased
